# Patient Record
Sex: Male | Born: 1950 | Race: White | Hispanic: No | Marital: Married | Smoking: Current every day smoker
Health system: Southern US, Community
[De-identification: ages and names within clinical notes are randomized; demographics above are authoritative.]

## PROBLEM LIST (undated history)

## (undated) DIAGNOSIS — I10 Essential (primary) hypertension: Secondary | ICD-10-CM

---

## 2001-03-14 ENCOUNTER — Ambulatory Visit: Admission: RE | Admit: 2001-03-14 | Discharge: 2001-03-14 | Payer: Self-pay | Admitting: Family Medicine

## 2017-10-19 DIAGNOSIS — L03312 Cellulitis of back [any part except buttock]: Secondary | ICD-10-CM | POA: Diagnosis not present

## 2017-10-19 DIAGNOSIS — D171 Benign lipomatous neoplasm of skin and subcutaneous tissue of trunk: Secondary | ICD-10-CM | POA: Diagnosis not present

## 2017-10-19 DIAGNOSIS — Z6831 Body mass index (BMI) 31.0-31.9, adult: Secondary | ICD-10-CM | POA: Diagnosis not present

## 2017-10-31 DIAGNOSIS — L03312 Cellulitis of back [any part except buttock]: Secondary | ICD-10-CM | POA: Diagnosis not present

## 2017-10-31 DIAGNOSIS — I10 Essential (primary) hypertension: Secondary | ICD-10-CM | POA: Diagnosis not present

## 2017-10-31 DIAGNOSIS — Z6830 Body mass index (BMI) 30.0-30.9, adult: Secondary | ICD-10-CM | POA: Diagnosis not present

## 2017-10-31 DIAGNOSIS — D171 Benign lipomatous neoplasm of skin and subcutaneous tissue of trunk: Secondary | ICD-10-CM | POA: Diagnosis not present

## 2017-10-31 DIAGNOSIS — Z1389 Encounter for screening for other disorder: Secondary | ICD-10-CM | POA: Diagnosis not present

## 2017-10-31 DIAGNOSIS — F17210 Nicotine dependence, cigarettes, uncomplicated: Secondary | ICD-10-CM | POA: Diagnosis not present

## 2020-03-17 ENCOUNTER — Emergency Department (HOSPITAL_COMMUNITY)
Admission: EM | Admit: 2020-03-17 | Discharge: 2020-03-17 | Disposition: A | Discharge status: Home / Self Care | Payer: Medicare HMO | Attending: Emergency Medicine | Admitting: Emergency Medicine

## 2020-03-17 ENCOUNTER — Emergency Department (HOSPITAL_COMMUNITY): Payer: Medicare HMO

## 2020-03-17 ENCOUNTER — Encounter (HOSPITAL_COMMUNITY): Payer: Self-pay

## 2020-03-17 ENCOUNTER — Other Ambulatory Visit: Payer: Self-pay

## 2020-03-17 DIAGNOSIS — I10 Essential (primary) hypertension: Secondary | ICD-10-CM | POA: Insufficient documentation

## 2020-03-17 DIAGNOSIS — R55 Syncope and collapse: Secondary | ICD-10-CM | POA: Diagnosis not present

## 2020-03-17 DIAGNOSIS — R531 Weakness: Secondary | ICD-10-CM | POA: Diagnosis present

## 2020-03-17 DIAGNOSIS — R42 Dizziness and giddiness: Secondary | ICD-10-CM | POA: Diagnosis not present

## 2020-03-17 HISTORY — DX: Essential (primary) hypertension: I10

## 2020-03-17 LAB — URINALYSIS, ROUTINE W REFLEX MICROSCOPIC
Bilirubin Urine: NEGATIVE
Glucose, UA: NEGATIVE mg/dL
Hgb urine dipstick: NEGATIVE
Ketones, ur: NEGATIVE mg/dL
Leukocytes,Ua: NEGATIVE
Nitrite: NEGATIVE
Protein, ur: NEGATIVE mg/dL
Specific Gravity, Urine: 1.008 (ref 1.005–1.030)
pH: 5 (ref 5.0–8.0)

## 2020-03-17 LAB — BASIC METABOLIC PANEL
Anion gap: 10 (ref 5–15)
BUN: 28 mg/dL — ABNORMAL HIGH (ref 8–23)
CO2: 26 mmol/L (ref 22–32)
Calcium: 9.5 mg/dL (ref 8.9–10.3)
Chloride: 100 mmol/L (ref 98–111)
Creatinine, Ser: 1.64 mg/dL — ABNORMAL HIGH (ref 0.61–1.24)
GFR calc Af Amer: 49 mL/min — ABNORMAL LOW (ref >60)
GFR calc non Af Amer: 42 mL/min — ABNORMAL LOW (ref >60)
Glucose, Bld: 137 mg/dL — ABNORMAL HIGH (ref 70–99)
Potassium: 3.9 mmol/L (ref 3.5–5.1)
Sodium: 136 mmol/L (ref 135–145)

## 2020-03-17 LAB — CBC
HCT: 46.6 % (ref 39.0–52.0)
Hemoglobin: 15.3 g/dL (ref 13.0–17.0)
MCH: 31.7 pg (ref 26.0–34.0)
MCHC: 32.8 g/dL (ref 30.0–36.0)
MCV: 96.7 fL (ref 80.0–100.0)
Platelets: 224 10*3/uL (ref 150–400)
RBC: 4.82 MIL/uL (ref 4.22–5.81)
RDW: 13.4 % (ref 11.5–15.5)
WBC: 8.1 10*3/uL (ref 4.0–10.5)
nRBC: 0 % (ref 0.0–0.2)

## 2020-03-17 LAB — CBG MONITORING, ED: Glucose-Capillary: 136 mg/dL — ABNORMAL HIGH (ref 70–99)

## 2020-03-17 MED ORDER — LACTATED RINGERS IV BOLUS
1000.0000 mL | Freq: Once | INTRAVENOUS | Status: AC
  Administered 2020-03-17: 1000 mL via INTRAVENOUS

## 2020-03-17 MED ORDER — LORAZEPAM 2 MG/ML IJ SOLN
1.0000 mg | Freq: Once | INTRAMUSCULAR | Status: AC
  Administered 2020-03-17: 1 mg via INTRAVENOUS
  Filled 2020-03-17: qty 1

## 2020-03-17 NOTE — ED Triage Notes (Signed)
Pt reports 2 days of generalized weakness, lightheaded and balance issues. Pt denies any pain, a.o.

## 2020-03-17 NOTE — ED Notes (Signed)
Pt transported to MRI via stretcher at this time.  °

## 2020-03-18 DIAGNOSIS — Z6830 Body mass index (BMI) 30.0-30.9, adult: Secondary | ICD-10-CM | POA: Diagnosis not present

## 2020-03-18 DIAGNOSIS — H6123 Impacted cerumen, bilateral: Secondary | ICD-10-CM | POA: Diagnosis not present

## 2020-03-18 DIAGNOSIS — R69 Illness, unspecified: Secondary | ICD-10-CM | POA: Diagnosis not present

## 2020-03-23 NOTE — ED Provider Notes (Signed)
[facility] EMERGENCY DEPARTMENT Provider Note   CSN: [id number] Arrival date & time: 03/17/20  0830     History Chief Complaint  Patient presents with  . Weakness  . Fall    Jerime Gonya Pakistan is a 69 y.o. male.  HPI   69 year old male with generalized weakness and feeling of lightheadedness.  Describes sensation as if he may pass out and also feeling off balance.  Onset about 2 days ago.  Symptoms pretty persistent since then.  No appreciable exacerbating factors.  Denies any acute pain.  No fevers or chills.  No acute respiratory complaints.  No acute visual symptoms.  Past Medical History:  Diagnosis Date  . Hypertension     There are no problems to display for this patient.   History reviewed. No pertinent surgical history.     No family history on file.  Social History   Tobacco Use  . Smoking status: Not on file  Substance Use Topics  . Alcohol use: Not on file  . Drug use: Not on file    Home Medications Prior to Admission medications   Not on File    Allergies    Patient has no allergy information on record.  Review of Systems   Review of Systems All systems reviewed and negative, other than as noted in HPI.  Physical Exam Updated Vital Signs BP (!) 141/89 (BP Location: Right Arm)   Pulse 79   Temp 98.5 F (36.9 C) (Oral)   Resp 13   Ht 5\' 8"  (1.727 m)   Wt 86.2 kg   SpO2 100%   BMI 28.89 kg/m   Physical Exam Vitals and nursing note reviewed.  Constitutional:      General: He is not in acute distress.    Appearance: He is well-developed.  HENT:     Head: Normocephalic and atraumatic.  Eyes:     General:        Right eye: No discharge.        Left eye: No discharge.     Conjunctiva/sclera: Conjunctivae normal.  Cardiovascular:     Rate and Rhythm: Normal rate and regular rhythm.     Heart sounds: Normal heart sounds. No murmur. No friction rub. No gallop.   Pulmonary:     Effort: Pulmonary effort is normal.  No respiratory distress.     Breath sounds: Normal breath sounds.  Abdominal:     General: There is no distension.     Palpations: Abdomen is soft.     Tenderness: There is no abdominal tenderness.  Musculoskeletal:        General: No tenderness.     Cervical back: Neck supple.  Skin:    General: Skin is warm and dry.  Neurological:     General: No focal deficit present.     Mental Status: He is alert and oriented to person, place, and time.     Cranial Nerves: No cranial nerve deficit.     Sensory: No sensory deficit.     Motor: No weakness.     Coordination: Coordination normal.  Psychiatric:        Behavior: Behavior normal.        Thought Content: Thought content normal.     ED Results / Procedures / Treatments   Labs (all labs ordered are listed, but only abnormal results are displayed) Labs Reviewed  BASIC METABOLIC PANEL - Abnormal; Notable for the following components:      Result Value  Glucose, Bld 137 (*)    BUN 28 (*)    Creatinine, Ser 1.64 (*)    GFR calc non Af Amer 42 (*)    GFR calc Af Amer 49 (*)    All other components within normal limits  URINALYSIS, ROUTINE W REFLEX MICROSCOPIC - Abnormal; Notable for the following components:   Color, Urine STRAW (*)    All other components within normal limits  CBG MONITORING, ED - Abnormal; Notable for the following components:   Glucose-Capillary 136 (*)    All other components within normal limits  CBC    EKG EKG Interpretation  Date/Time:  Thursday March 17 2020 08:50:51 EDT Ventricular Rate:  71 PR Interval:  128 QRS Duration: 134 QT Interval:  420 QTC Calculation: 456 R Axis:   72 Text Interpretation: Normal sinus rhythm Right bundle branch block Abnormal ECG Confirmed by Madalyn Rob [phone]) on 03/18/2020 3:55:37 PM   Radiology No results found.   MR BRAIN WO CONTRAST  Result Date: 03/17/2020 CLINICAL DATA:  Provided history: Dizziness, nonspecific. Additional history provided: Patient  reports 2 days of generalized weakness, lightheadedness and balance issues. EXAM: MRI HEAD WITHOUT CONTRAST TECHNIQUE: Multiplanar, multiecho pulse sequences of the brain and surrounding structures were obtained without intravenous contrast. COMPARISON:  No pertinent prior studies available for comparison. FINDINGS: Brain: Mild intermittent motion degradation. There is no evidence of acute infarct. No evidence of intracranial mass. No midline shift or extra-axial fluid collection. No chronic intracranial blood products. 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 Mild scattered T2/FLAIR hyperintensity within the cerebral white matter and pons is nonspecific, but consistent with chronic small vessel ischemic disease. Mild generalized parenchymal atrophy. Prominent dural calcification along the anterior falx. Vascular: Flow voids maintained within the proximal large arterial vessels. Skull and upper cervical spine: No focal marrow lesion. Sinuses/Orbits: Visualized orbits demonstrate no acute abnormality. Mild paranasal sinus mucosal thickening within the ethmoid and sphenoid sinuses as well as right maxillary sinus. Partial opacification of a posterior right ethmoid air cell. No significant mastoid effusion. IMPRESSION: Mildly motion degraded exam. No evidence of acute intracranial abnormality, including acute infarction. Mild generalized parenchymal atrophy and chronic small vessel ischemic disease. 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 Paranasal sinus disease as described. Electronically Signed   By: Kellie Simmering DO   On: 03/17/2020 12:53    Procedures Procedures (including critical care time)  Medications Ordered in ED Medications  lactated ringers bolus 1,000 mL (0 mLs Intravenous Stopped 03/17/20 1429)  LORazepam (ATIVAN) injection 1 mg (1 mg Intravenous Given 03/17/20 1140)    ED Course  I have reviewed the triage vital signs and the  nursing notes.  Pertinent labs & imaging results that were available during my care of the patient were reviewed by me and considered in my medical decision making (see chart for details).    MDM Rules/Calculators/A&P                      69 year old male with dizziness versus near syncope.  ED work-up fairly unremarkable.  Afebrile.  Hemodynamically stable.  Neuro exam nonfocal.  MRI not showing any acute pathology.  I doubt emergent process.  PCP for ED follow-up for persistent symptoms.  Return precautions discussed.  Final Clinical Impression(s) / ED Diagnoses Final diagnoses:  Near syncope    Rx / DC Orders ED Discharge Orders    None       Kaled Allende,  Annie Main, MD 03/23/20 1207

## 2020-08-12 DIAGNOSIS — E782 Mixed hyperlipidemia: Secondary | ICD-10-CM | POA: Diagnosis not present

## 2020-08-12 DIAGNOSIS — I10 Essential (primary) hypertension: Secondary | ICD-10-CM | POA: Diagnosis not present

## 2020-08-15 DIAGNOSIS — I10 Essential (primary) hypertension: Secondary | ICD-10-CM | POA: Diagnosis not present

## 2020-08-15 DIAGNOSIS — E782 Mixed hyperlipidemia: Secondary | ICD-10-CM | POA: Diagnosis not present

## 2020-08-15 IMAGING — MR MR HEAD W/O CM
9 of 10 series · 38 of 48 positions shown · non-contrast
Comparison: No pertinent prior studies available for comparison.

CLINICAL DATA: Provided history: Dizziness, nonspecific. Additional
history provided: Patient reports 2 days of generalized weakness,
lightheadedness and balance issues.

EXAM:
MRI HEAD WITHOUT CONTRAST
TECHNIQUE: Multiplanar, multiecho pulse sequences of the brain and surrounding
structures were obtained without intravenous contrast.

[Series 3: DWI · axial · 3.0mm · 1.09mm/px · z∈[-64,+98]mm · 11 of 110 slices shown (1 of 4)]
[im 1/110]
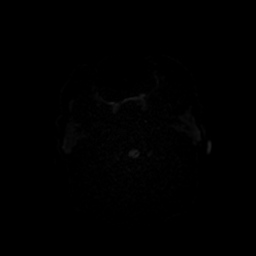
[im 11/110]
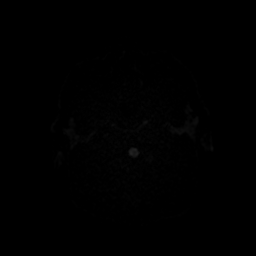
[im 22/110]
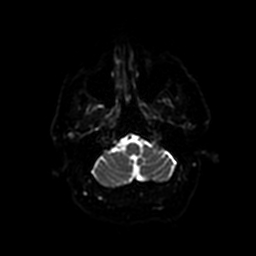
[im 33/110]
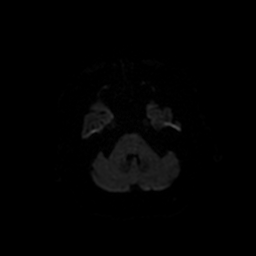
[im 44/110]
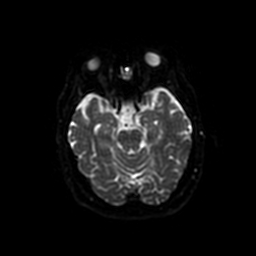
[im 55/110]
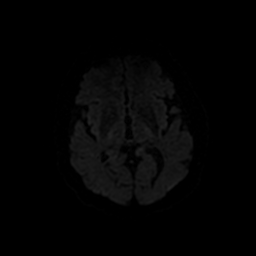
[im 66/110]
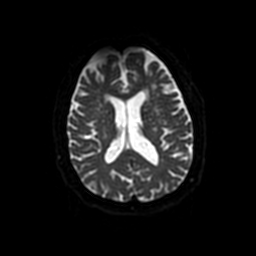
[im 77/110]
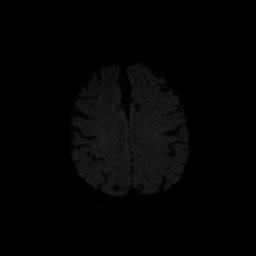
[im 88/110]
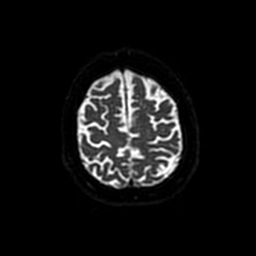
[im 99/110]
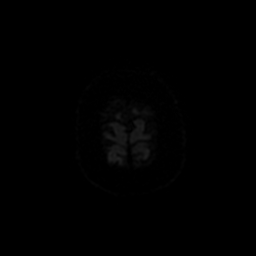
[im 110/110]
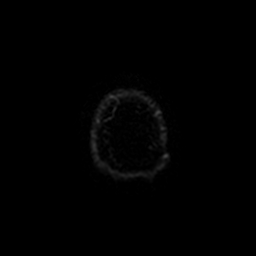

[Series 4: DWI · coronal · 5.0mm · 1.09mm/px · 8 of 76 slices shown (2 of 4)]
[im 1/76]
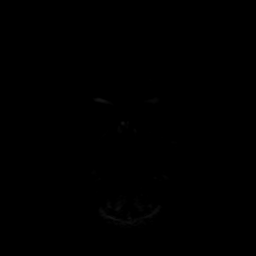
[im 11/76]
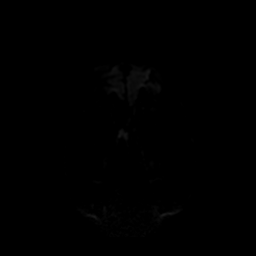
[im 22/76]
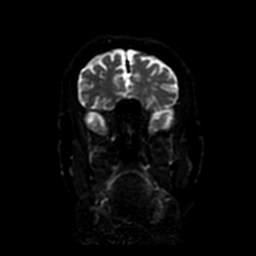
[im 33/76]
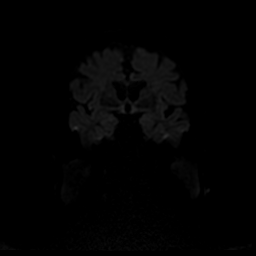
[im 43/76]
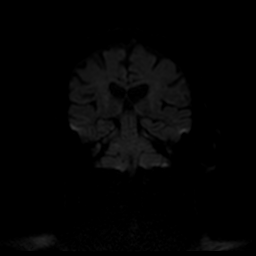
[im 54/76]
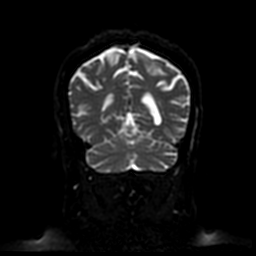
[im 65/76]
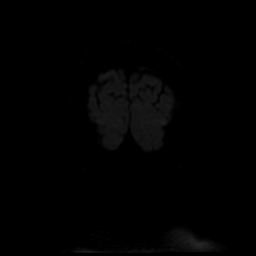
[im 76/76]
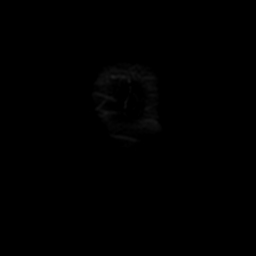

[Series 5: T1 · sagittal · 5.0mm · 0.47mm/px · 2 of 24 slices shown (1 of 2)]
[im 1/24]
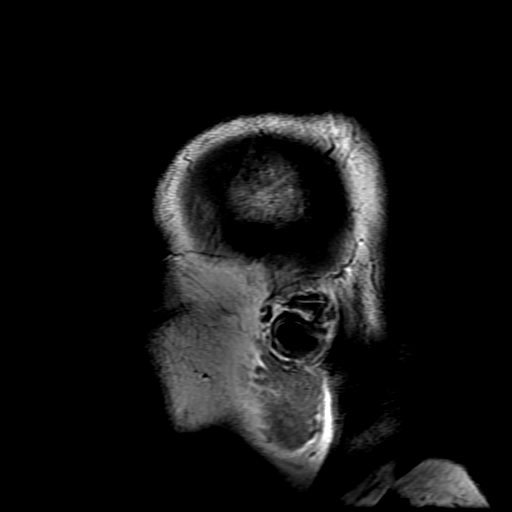
[im 24/24]
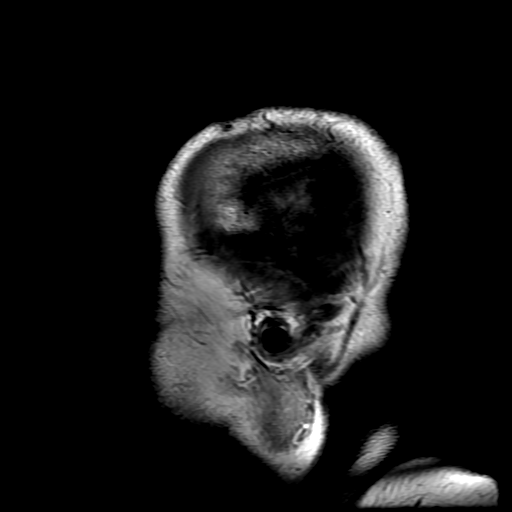

[Series 6: T2 · axial · 5.0mm · 0.43mm/px · z∈[-71,+79]mm · 2 of 26 slices shown (1 of 2)]
[im 1/26]
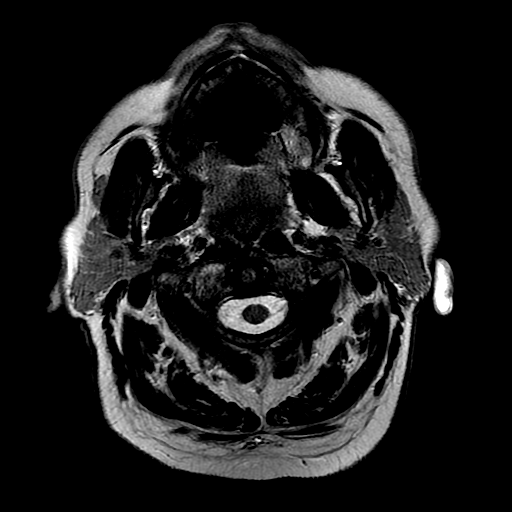
[im 26/26]
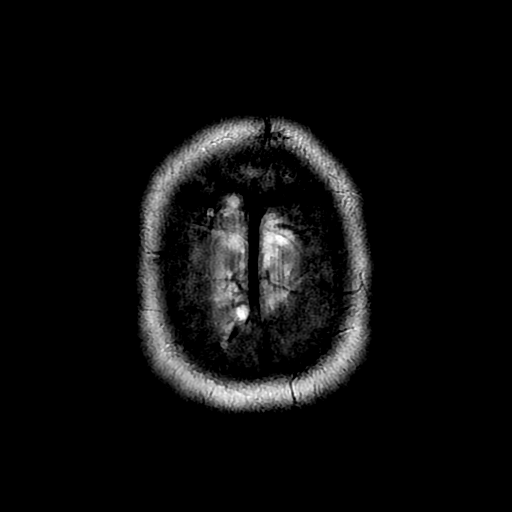

[Series 7: FLAIR · axial · 3.0mm · 0.43mm/px · z∈[-71,+79]mm · 2 of 26 slices shown]
[im 1/26]
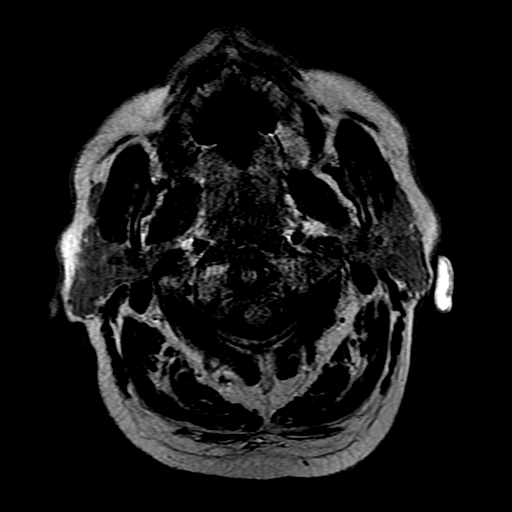
[im 26/26]
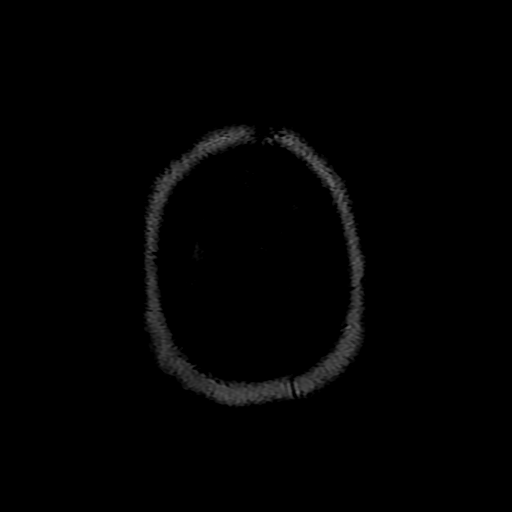

[Series 9: T1 · axial · 3.0mm · 0.47mm/px · z∈[-71,-55]mm · 2 of 108 slices shown (2 of 2)]
[im 1/108]
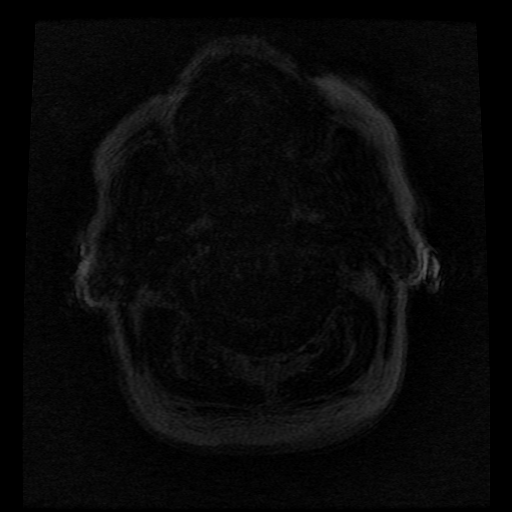
[im 12/108]
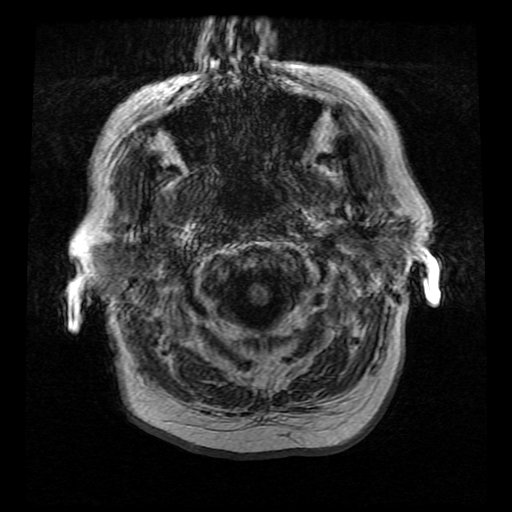

[Series 10: T2 · coronal · 5.0mm · 0.39mm/px · 2 of 24 slices shown (2 of 2)]
[im 1/24]
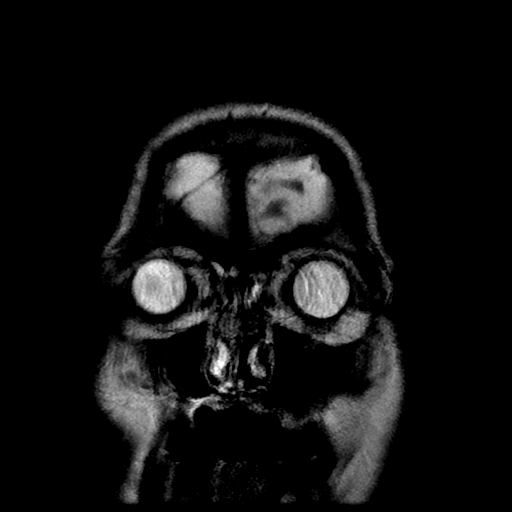
[im 24/24]
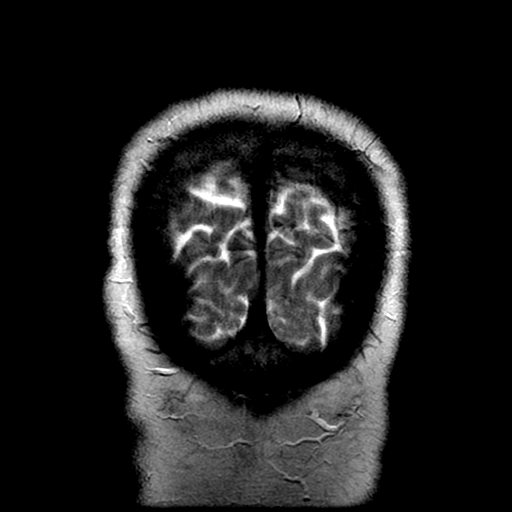

[Series 300: DWI · axial · 3.0mm · 1.09mm/px · z∈[-64,+98]mm · 5 of 55 slices shown (3 of 4)]
[im 1/55]
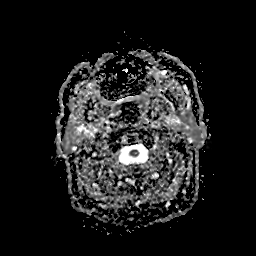
[im 14/55]
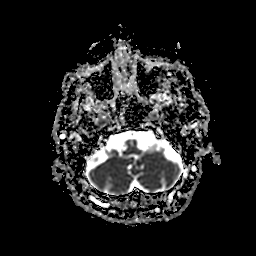
[im 28/55]
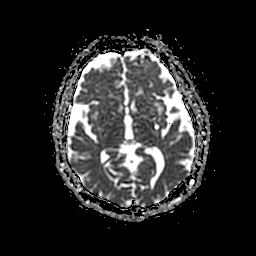
[im 41/55]
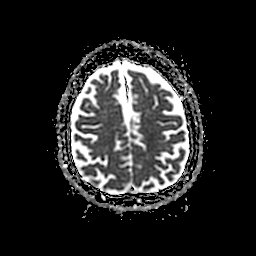
[im 55/55]
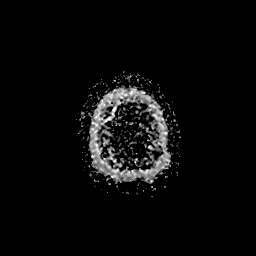

[Series 400: DWI · coronal · 5.0mm · 1.09mm/px · 4 of 38 slices shown (4 of 4)]
[im 1/38]
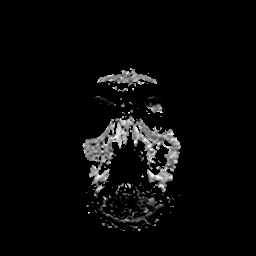
[im 13/38]
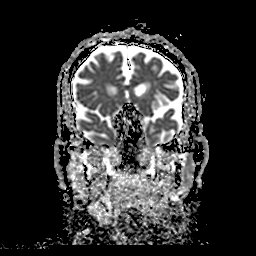
[im 25/38]
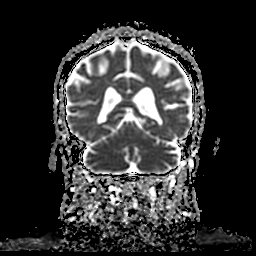
[im 38/38]
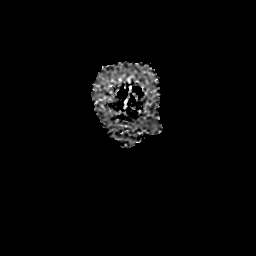

[38 of 48 positions shown; findings below may reference images not displayed]

FINDINGS: Brain:

Mild intermittent motion degradation.

There is no evidence of acute infarct.

No evidence of intracranial mass.

No midline shift or extra-axial fluid collection.

No chronic intracranial blood products.

Chronic lacunar infarcts within the left basal ganglia and left
thalamus. Mild scattered T2/FLAIR hyperintensity within the cerebral
white matter and pons is nonspecific, but consistent with chronic
small vessel ischemic disease. Mild generalized parenchymal atrophy.
Prominent dural calcification along the anterior falx.

Vascular: Flow voids maintained within the proximal large arterial
vessels.

Skull and upper cervical spine: No focal marrow lesion.

Sinuses/Orbits: Visualized orbits demonstrate no acute abnormality.
Mild paranasal sinus mucosal thickening within the ethmoid and
sphenoid sinuses as well as right maxillary sinus. Partial
opacification of a posterior right ethmoid air cell. No significant
mastoid effusion.
IMPRESSION: Mildly motion degraded exam.

No evidence of acute intracranial abnormality, including acute
infarction.

Mild generalized parenchymal atrophy and chronic small vessel
ischemic disease. Chronic lacunar infarcts within the left basal
ganglia and left thalamus.

Paranasal sinus disease as described.

## 2020-08-17 DIAGNOSIS — Z7189 Other specified counseling: Secondary | ICD-10-CM | POA: Diagnosis not present

## 2020-08-17 DIAGNOSIS — Z6832 Body mass index (BMI) 32.0-32.9, adult: Secondary | ICD-10-CM | POA: Diagnosis not present

## 2020-08-17 DIAGNOSIS — E782 Mixed hyperlipidemia: Secondary | ICD-10-CM | POA: Diagnosis not present

## 2020-08-17 DIAGNOSIS — Z1331 Encounter for screening for depression: Secondary | ICD-10-CM | POA: Diagnosis not present

## 2020-08-17 DIAGNOSIS — Z1389 Encounter for screening for other disorder: Secondary | ICD-10-CM | POA: Diagnosis not present

## 2020-08-17 DIAGNOSIS — I10 Essential (primary) hypertension: Secondary | ICD-10-CM | POA: Diagnosis not present

## 2020-08-17 DIAGNOSIS — R69 Illness, unspecified: Secondary | ICD-10-CM | POA: Diagnosis not present

## 2020-08-19 DIAGNOSIS — E782 Mixed hyperlipidemia: Secondary | ICD-10-CM | POA: Diagnosis not present

## 2020-08-19 DIAGNOSIS — Z0001 Encounter for general adult medical examination with abnormal findings: Secondary | ICD-10-CM | POA: Diagnosis not present

## 2020-08-19 DIAGNOSIS — I10 Essential (primary) hypertension: Secondary | ICD-10-CM | POA: Diagnosis not present

## 2020-08-19 DIAGNOSIS — R69 Illness, unspecified: Secondary | ICD-10-CM | POA: Diagnosis not present

## 2020-12-16 DIAGNOSIS — Z1329 Encounter for screening for other suspected endocrine disorder: Secondary | ICD-10-CM | POA: Diagnosis not present

## 2020-12-16 DIAGNOSIS — I10 Essential (primary) hypertension: Secondary | ICD-10-CM | POA: Diagnosis not present

## 2020-12-16 DIAGNOSIS — Z1159 Encounter for screening for other viral diseases: Secondary | ICD-10-CM | POA: Diagnosis not present

## 2020-12-16 DIAGNOSIS — Z125 Encounter for screening for malignant neoplasm of prostate: Secondary | ICD-10-CM | POA: Diagnosis not present

## 2020-12-16 DIAGNOSIS — E782 Mixed hyperlipidemia: Secondary | ICD-10-CM | POA: Diagnosis not present

## 2020-12-16 DIAGNOSIS — Z720 Tobacco use: Secondary | ICD-10-CM | POA: Diagnosis not present

## 2020-12-16 DIAGNOSIS — E7849 Other hyperlipidemia: Secondary | ICD-10-CM | POA: Diagnosis not present

## 2020-12-28 DIAGNOSIS — Z6834 Body mass index (BMI) 34.0-34.9, adult: Secondary | ICD-10-CM | POA: Diagnosis not present

## 2020-12-28 DIAGNOSIS — G4733 Obstructive sleep apnea (adult) (pediatric): Secondary | ICD-10-CM | POA: Diagnosis not present

## 2020-12-28 DIAGNOSIS — E7849 Other hyperlipidemia: Secondary | ICD-10-CM | POA: Diagnosis not present

## 2020-12-28 DIAGNOSIS — N1831 Chronic kidney disease, stage 3a: Secondary | ICD-10-CM | POA: Diagnosis not present

## 2020-12-28 DIAGNOSIS — I10 Essential (primary) hypertension: Secondary | ICD-10-CM | POA: Diagnosis not present

## 2020-12-28 DIAGNOSIS — R69 Illness, unspecified: Secondary | ICD-10-CM | POA: Diagnosis not present

## 2020-12-28 DIAGNOSIS — J441 Chronic obstructive pulmonary disease with (acute) exacerbation: Secondary | ICD-10-CM | POA: Diagnosis not present

## 2021-01-20 DIAGNOSIS — R059 Cough, unspecified: Secondary | ICD-10-CM | POA: Diagnosis not present

## 2021-01-20 DIAGNOSIS — J329 Chronic sinusitis, unspecified: Secondary | ICD-10-CM | POA: Diagnosis not present

## 2021-01-20 DIAGNOSIS — Z20828 Contact with and (suspected) exposure to other viral communicable diseases: Secondary | ICD-10-CM | POA: Diagnosis not present

## 2021-02-21 DIAGNOSIS — J329 Chronic sinusitis, unspecified: Secondary | ICD-10-CM | POA: Diagnosis not present

## 2021-02-21 DIAGNOSIS — I10 Essential (primary) hypertension: Secondary | ICD-10-CM | POA: Diagnosis not present

## 2021-04-12 DIAGNOSIS — A084 Viral intestinal infection, unspecified: Secondary | ICD-10-CM | POA: Diagnosis not present

## 2021-04-12 DIAGNOSIS — Z20828 Contact with and (suspected) exposure to other viral communicable diseases: Secondary | ICD-10-CM | POA: Diagnosis not present

## 2021-04-21 DIAGNOSIS — N1831 Chronic kidney disease, stage 3a: Secondary | ICD-10-CM | POA: Diagnosis not present

## 2021-04-21 DIAGNOSIS — E782 Mixed hyperlipidemia: Secondary | ICD-10-CM | POA: Diagnosis not present

## 2021-04-21 DIAGNOSIS — E7849 Other hyperlipidemia: Secondary | ICD-10-CM | POA: Diagnosis not present

## 2021-04-21 DIAGNOSIS — I10 Essential (primary) hypertension: Secondary | ICD-10-CM | POA: Diagnosis not present

## 2021-04-25 DIAGNOSIS — J449 Chronic obstructive pulmonary disease, unspecified: Secondary | ICD-10-CM | POA: Diagnosis not present

## 2021-04-25 DIAGNOSIS — I10 Essential (primary) hypertension: Secondary | ICD-10-CM | POA: Diagnosis not present

## 2021-04-25 DIAGNOSIS — Z6832 Body mass index (BMI) 32.0-32.9, adult: Secondary | ICD-10-CM | POA: Diagnosis not present

## 2021-04-25 DIAGNOSIS — E7849 Other hyperlipidemia: Secondary | ICD-10-CM | POA: Diagnosis not present

## 2021-04-25 DIAGNOSIS — G4733 Obstructive sleep apnea (adult) (pediatric): Secondary | ICD-10-CM | POA: Diagnosis not present

## 2021-04-25 DIAGNOSIS — R69 Illness, unspecified: Secondary | ICD-10-CM | POA: Diagnosis not present

## 2021-04-25 DIAGNOSIS — N1831 Chronic kidney disease, stage 3a: Secondary | ICD-10-CM | POA: Diagnosis not present

## 2021-04-25 DIAGNOSIS — F17210 Nicotine dependence, cigarettes, uncomplicated: Secondary | ICD-10-CM | POA: Diagnosis not present

## 2021-07-03 DIAGNOSIS — H04123 Dry eye syndrome of bilateral lacrimal glands: Secondary | ICD-10-CM | POA: Diagnosis not present

## 2021-08-28 DIAGNOSIS — H04123 Dry eye syndrome of bilateral lacrimal glands: Secondary | ICD-10-CM | POA: Diagnosis not present

## 2021-09-01 DIAGNOSIS — R739 Hyperglycemia, unspecified: Secondary | ICD-10-CM | POA: Diagnosis not present

## 2021-09-01 DIAGNOSIS — E782 Mixed hyperlipidemia: Secondary | ICD-10-CM | POA: Diagnosis not present

## 2021-09-01 DIAGNOSIS — N1831 Chronic kidney disease, stage 3a: Secondary | ICD-10-CM | POA: Diagnosis not present

## 2021-09-01 DIAGNOSIS — E7849 Other hyperlipidemia: Secondary | ICD-10-CM | POA: Diagnosis not present

## 2021-09-01 DIAGNOSIS — I10 Essential (primary) hypertension: Secondary | ICD-10-CM | POA: Diagnosis not present

## 2021-09-04 DIAGNOSIS — N1831 Chronic kidney disease, stage 3a: Secondary | ICD-10-CM | POA: Diagnosis not present

## 2021-09-04 DIAGNOSIS — E7849 Other hyperlipidemia: Secondary | ICD-10-CM | POA: Diagnosis not present

## 2021-09-04 DIAGNOSIS — R69 Illness, unspecified: Secondary | ICD-10-CM | POA: Diagnosis not present

## 2021-09-04 DIAGNOSIS — Z0001 Encounter for general adult medical examination with abnormal findings: Secondary | ICD-10-CM | POA: Diagnosis not present

## 2021-09-04 DIAGNOSIS — I10 Essential (primary) hypertension: Secondary | ICD-10-CM | POA: Diagnosis not present

## 2021-09-04 DIAGNOSIS — J449 Chronic obstructive pulmonary disease, unspecified: Secondary | ICD-10-CM | POA: Diagnosis not present

## 2021-09-04 DIAGNOSIS — Z6832 Body mass index (BMI) 32.0-32.9, adult: Secondary | ICD-10-CM | POA: Diagnosis not present

## 2021-09-19 DIAGNOSIS — F17210 Nicotine dependence, cigarettes, uncomplicated: Secondary | ICD-10-CM | POA: Diagnosis not present

## 2021-09-19 DIAGNOSIS — Z122 Encounter for screening for malignant neoplasm of respiratory organs: Secondary | ICD-10-CM | POA: Diagnosis not present

## 2021-09-19 DIAGNOSIS — R69 Illness, unspecified: Secondary | ICD-10-CM | POA: Diagnosis not present

## 2021-09-27 DIAGNOSIS — R062 Wheezing: Secondary | ICD-10-CM | POA: Diagnosis not present

## 2021-09-27 DIAGNOSIS — J441 Chronic obstructive pulmonary disease with (acute) exacerbation: Secondary | ICD-10-CM | POA: Diagnosis not present

## 2021-09-27 DIAGNOSIS — R059 Cough, unspecified: Secondary | ICD-10-CM | POA: Diagnosis not present

## 2021-12-01 DIAGNOSIS — H52223 Regular astigmatism, bilateral: Secondary | ICD-10-CM | POA: Diagnosis not present

## 2021-12-01 DIAGNOSIS — H5203 Hypermetropia, bilateral: Secondary | ICD-10-CM | POA: Diagnosis not present

## 2021-12-01 DIAGNOSIS — H25013 Cortical age-related cataract, bilateral: Secondary | ICD-10-CM | POA: Diagnosis not present

## 2021-12-01 DIAGNOSIS — H524 Presbyopia: Secondary | ICD-10-CM | POA: Diagnosis not present

## 2021-12-01 DIAGNOSIS — H353112 Nonexudative age-related macular degeneration, right eye, intermediate dry stage: Secondary | ICD-10-CM | POA: Diagnosis not present

## 2021-12-01 DIAGNOSIS — H2513 Age-related nuclear cataract, bilateral: Secondary | ICD-10-CM | POA: Diagnosis not present

## 2021-12-01 DIAGNOSIS — H25813 Combined forms of age-related cataract, bilateral: Secondary | ICD-10-CM | POA: Diagnosis not present

## 2021-12-01 DIAGNOSIS — H353122 Nonexudative age-related macular degeneration, left eye, intermediate dry stage: Secondary | ICD-10-CM | POA: Diagnosis not present

## 2021-12-29 DIAGNOSIS — R69 Illness, unspecified: Secondary | ICD-10-CM | POA: Diagnosis not present

## 2021-12-29 DIAGNOSIS — Z6832 Body mass index (BMI) 32.0-32.9, adult: Secondary | ICD-10-CM | POA: Diagnosis not present

## 2021-12-29 DIAGNOSIS — R059 Cough, unspecified: Secondary | ICD-10-CM | POA: Diagnosis not present

## 2021-12-29 DIAGNOSIS — J449 Chronic obstructive pulmonary disease, unspecified: Secondary | ICD-10-CM | POA: Diagnosis not present

## 2021-12-29 DIAGNOSIS — Z20828 Contact with and (suspected) exposure to other viral communicable diseases: Secondary | ICD-10-CM | POA: Diagnosis not present

## 2022-02-05 DIAGNOSIS — I700 Atherosclerosis of aorta: Secondary | ICD-10-CM | POA: Diagnosis not present

## 2022-02-05 DIAGNOSIS — Z1212 Encounter for screening for malignant neoplasm of rectum: Secondary | ICD-10-CM | POA: Diagnosis not present

## 2022-02-05 DIAGNOSIS — Z6833 Body mass index (BMI) 33.0-33.9, adult: Secondary | ICD-10-CM | POA: Diagnosis not present

## 2022-02-05 DIAGNOSIS — F17210 Nicotine dependence, cigarettes, uncomplicated: Secondary | ICD-10-CM | POA: Diagnosis not present

## 2022-02-05 DIAGNOSIS — R69 Illness, unspecified: Secondary | ICD-10-CM | POA: Diagnosis not present

## 2022-02-05 DIAGNOSIS — Z23 Encounter for immunization: Secondary | ICD-10-CM | POA: Diagnosis not present

## 2022-02-05 DIAGNOSIS — E782 Mixed hyperlipidemia: Secondary | ICD-10-CM | POA: Diagnosis not present

## 2022-02-05 DIAGNOSIS — F331 Major depressive disorder, recurrent, moderate: Secondary | ICD-10-CM | POA: Diagnosis not present

## 2022-02-05 DIAGNOSIS — I10 Essential (primary) hypertension: Secondary | ICD-10-CM | POA: Diagnosis not present

## 2022-02-05 DIAGNOSIS — N1831 Chronic kidney disease, stage 3a: Secondary | ICD-10-CM | POA: Diagnosis not present

## 2022-02-05 DIAGNOSIS — G4733 Obstructive sleep apnea (adult) (pediatric): Secondary | ICD-10-CM | POA: Diagnosis not present

## 2022-02-05 DIAGNOSIS — E6609 Other obesity due to excess calories: Secondary | ICD-10-CM | POA: Diagnosis not present

## 2022-02-05 DIAGNOSIS — J449 Chronic obstructive pulmonary disease, unspecified: Secondary | ICD-10-CM | POA: Diagnosis not present

## 2022-02-05 DIAGNOSIS — E7849 Other hyperlipidemia: Secondary | ICD-10-CM | POA: Diagnosis not present

## 2022-06-26 DIAGNOSIS — H353122 Nonexudative age-related macular degeneration, left eye, intermediate dry stage: Secondary | ICD-10-CM | POA: Diagnosis not present

## 2022-08-01 DIAGNOSIS — E782 Mixed hyperlipidemia: Secondary | ICD-10-CM | POA: Diagnosis not present

## 2022-08-01 DIAGNOSIS — I10 Essential (primary) hypertension: Secondary | ICD-10-CM | POA: Diagnosis not present

## 2022-08-01 DIAGNOSIS — N1831 Chronic kidney disease, stage 3a: Secondary | ICD-10-CM | POA: Diagnosis not present

## 2022-08-01 DIAGNOSIS — E7849 Other hyperlipidemia: Secondary | ICD-10-CM | POA: Diagnosis not present

## 2022-08-01 DIAGNOSIS — R739 Hyperglycemia, unspecified: Secondary | ICD-10-CM | POA: Diagnosis not present

## 2022-08-01 DIAGNOSIS — Z125 Encounter for screening for malignant neoplasm of prostate: Secondary | ICD-10-CM | POA: Diagnosis not present

## 2022-08-01 DIAGNOSIS — Z1329 Encounter for screening for other suspected endocrine disorder: Secondary | ICD-10-CM | POA: Diagnosis not present

## 2022-08-01 DIAGNOSIS — Z0001 Encounter for general adult medical examination with abnormal findings: Secondary | ICD-10-CM | POA: Diagnosis not present

## 2022-08-08 DIAGNOSIS — E7849 Other hyperlipidemia: Secondary | ICD-10-CM | POA: Diagnosis not present

## 2022-08-08 DIAGNOSIS — Z0001 Encounter for general adult medical examination with abnormal findings: Secondary | ICD-10-CM | POA: Diagnosis not present

## 2022-08-08 DIAGNOSIS — E6609 Other obesity due to excess calories: Secondary | ICD-10-CM | POA: Diagnosis not present

## 2022-08-08 DIAGNOSIS — I700 Atherosclerosis of aorta: Secondary | ICD-10-CM | POA: Diagnosis not present

## 2022-08-08 DIAGNOSIS — Z23 Encounter for immunization: Secondary | ICD-10-CM | POA: Diagnosis not present

## 2022-08-08 DIAGNOSIS — J449 Chronic obstructive pulmonary disease, unspecified: Secondary | ICD-10-CM | POA: Diagnosis not present

## 2022-08-08 DIAGNOSIS — R69 Illness, unspecified: Secondary | ICD-10-CM | POA: Diagnosis not present

## 2022-08-08 DIAGNOSIS — I10 Essential (primary) hypertension: Secondary | ICD-10-CM | POA: Diagnosis not present

## 2022-08-08 DIAGNOSIS — Z6832 Body mass index (BMI) 32.0-32.9, adult: Secondary | ICD-10-CM | POA: Diagnosis not present

## 2022-08-08 DIAGNOSIS — N1831 Chronic kidney disease, stage 3a: Secondary | ICD-10-CM | POA: Diagnosis not present

## 2022-08-08 DIAGNOSIS — G4733 Obstructive sleep apnea (adult) (pediatric): Secondary | ICD-10-CM | POA: Diagnosis not present

## 2023-01-02 ENCOUNTER — Other Ambulatory Visit: Payer: Self-pay

## 2023-01-02 ENCOUNTER — Encounter (HOSPITAL_COMMUNITY): Payer: Self-pay

## 2023-01-02 ENCOUNTER — Inpatient Hospital Stay (HOSPITAL_COMMUNITY)
Admission: EM | Admit: 2023-01-02 | Discharge: 2023-01-05 | DRG: 916 | Disposition: A | Discharge status: Home / Self Care | Payer: Medicare HMO | Attending: Family Medicine | Admitting: Family Medicine

## 2023-01-02 ENCOUNTER — Emergency Department (HOSPITAL_COMMUNITY): Payer: Medicare HMO

## 2023-01-02 DIAGNOSIS — J384 Edema of larynx: Secondary | ICD-10-CM | POA: Diagnosis not present

## 2023-01-02 DIAGNOSIS — T783XXA Angioneurotic edema, initial encounter: Principal | ICD-10-CM | POA: Diagnosis present

## 2023-01-02 DIAGNOSIS — J441 Chronic obstructive pulmonary disease with (acute) exacerbation: Secondary | ICD-10-CM | POA: Diagnosis not present

## 2023-01-02 DIAGNOSIS — E782 Mixed hyperlipidemia: Secondary | ICD-10-CM | POA: Diagnosis not present

## 2023-01-02 DIAGNOSIS — T464X5A Adverse effect of angiotensin-converting-enzyme inhibitors, initial encounter: Secondary | ICD-10-CM | POA: Diagnosis present

## 2023-01-02 DIAGNOSIS — Z7982 Long term (current) use of aspirin: Secondary | ICD-10-CM

## 2023-01-02 DIAGNOSIS — F17200 Nicotine dependence, unspecified, uncomplicated: Secondary | ICD-10-CM | POA: Insufficient documentation

## 2023-01-02 DIAGNOSIS — Z1152 Encounter for screening for COVID-19: Secondary | ICD-10-CM

## 2023-01-02 DIAGNOSIS — J449 Chronic obstructive pulmonary disease, unspecified: Secondary | ICD-10-CM | POA: Diagnosis present

## 2023-01-02 DIAGNOSIS — N179 Acute kidney failure, unspecified: Secondary | ICD-10-CM | POA: Diagnosis not present

## 2023-01-02 DIAGNOSIS — I10 Essential (primary) hypertension: Secondary | ICD-10-CM | POA: Diagnosis present

## 2023-01-02 DIAGNOSIS — F1010 Alcohol abuse, uncomplicated: Secondary | ICD-10-CM | POA: Diagnosis present

## 2023-01-02 DIAGNOSIS — Z79899 Other long term (current) drug therapy: Secondary | ICD-10-CM

## 2023-01-02 DIAGNOSIS — E876 Hypokalemia: Secondary | ICD-10-CM | POA: Diagnosis present

## 2023-01-02 DIAGNOSIS — E669 Obesity, unspecified: Secondary | ICD-10-CM | POA: Diagnosis present

## 2023-01-02 DIAGNOSIS — E785 Hyperlipidemia, unspecified: Secondary | ICD-10-CM | POA: Insufficient documentation

## 2023-01-02 DIAGNOSIS — Z7951 Long term (current) use of inhaled steroids: Secondary | ICD-10-CM

## 2023-01-02 DIAGNOSIS — F17210 Nicotine dependence, cigarettes, uncomplicated: Secondary | ICD-10-CM | POA: Diagnosis present

## 2023-01-02 DIAGNOSIS — Z6831 Body mass index (BMI) 31.0-31.9, adult: Secondary | ICD-10-CM

## 2023-01-02 DIAGNOSIS — Z66 Do not resuscitate: Secondary | ICD-10-CM | POA: Diagnosis present

## 2023-01-02 LAB — COMPREHENSIVE METABOLIC PANEL
ALT: 28 U/L (ref 0–44)
AST: 31 U/L (ref 15–41)
Albumin: 4.1 g/dL (ref 3.5–5.0)
Alkaline Phosphatase: 60 U/L (ref 38–126)
Anion gap: 14 (ref 5–15)
BUN: 13 mg/dL (ref 8–23)
CO2: 27 mmol/L (ref 22–32)
Calcium: 9.8 mg/dL (ref 8.9–10.3)
Chloride: 95 mmol/L — ABNORMAL LOW (ref 98–111)
Creatinine, Ser: 1.12 mg/dL (ref 0.61–1.24)
GFR, Estimated: >60 mL/min (ref >60)
Glucose, Bld: 84 mg/dL (ref 70–99)
Potassium: 3 mmol/L — ABNORMAL LOW (ref 3.5–5.1)
Sodium: 136 mmol/L (ref 135–145)
Total Bilirubin: 0.7 mg/dL (ref 0.3–1.2)
Total Protein: 7.2 g/dL (ref 6.5–8.1)

## 2023-01-02 LAB — CBC WITH DIFFERENTIAL/PLATELET
Abs Immature Granulocytes: 0.02 10*3/uL (ref 0.00–0.07)
Basophils Absolute: 0 10*3/uL (ref 0.0–0.1)
Basophils Relative: 1 %
Eosinophils Absolute: 0.2 10*3/uL (ref 0.0–0.5)
Eosinophils Relative: 3 %
HCT: 49.7 % (ref 39.0–52.0)
Hemoglobin: 16.9 g/dL (ref 13.0–17.0)
Immature Granulocytes: 0 %
Lymphocytes Relative: 28 %
Lymphs Abs: 2.3 10*3/uL (ref 0.7–4.0)
MCH: 32.6 pg (ref 26.0–34.0)
MCHC: 34 g/dL (ref 30.0–36.0)
MCV: 95.9 fL (ref 80.0–100.0)
Monocytes Absolute: 1.1 10*3/uL — ABNORMAL HIGH (ref 0.1–1.0)
Monocytes Relative: 13 %
Neutro Abs: 4.7 10*3/uL (ref 1.7–7.7)
Neutrophils Relative %: 55 %
Platelets: 202 10*3/uL (ref 150–400)
RBC: 5.18 MIL/uL (ref 4.22–5.81)
RDW: 13.9 % (ref 11.5–15.5)
WBC: 8.3 10*3/uL (ref 4.0–10.5)
nRBC: 0 % (ref 0.0–0.2)

## 2023-01-02 LAB — RESP PANEL BY RT-PCR (RSV, FLU A&B, COVID)  RVPGX2
Influenza A by PCR: NEGATIVE
Influenza B by PCR: NEGATIVE
Resp Syncytial Virus by PCR: NEGATIVE
SARS Coronavirus 2 by RT PCR: NEGATIVE

## 2023-01-02 LAB — GROUP A STREP BY PCR: Group A Strep by PCR: NOT DETECTED

## 2023-01-02 MED ORDER — HEPARIN SODIUM (PORCINE) 5000 UNIT/ML IJ SOLN
5000.0000 [IU] | Freq: Three times a day (TID) | INTRAMUSCULAR | Status: DC
  Administered 2023-01-03 (×2): 5000 [IU] via SUBCUTANEOUS
  Filled 2023-01-02 (×2): qty 1

## 2023-01-02 MED ORDER — DIPHENHYDRAMINE HCL 50 MG/ML IJ SOLN
25.0000 mg | Freq: Four times a day (QID) | INTRAMUSCULAR | Status: DC
  Administered 2023-01-03 – 2023-01-04 (×5): 25 mg via INTRAVENOUS
  Filled 2023-01-02 (×5): qty 1

## 2023-01-02 MED ORDER — HYDROCHLOROTHIAZIDE 25 MG PO TABS
25.0000 mg | ORAL_TABLET | Freq: Every day | ORAL | Status: DC
  Administered 2023-01-03: 25 mg via ORAL
  Filled 2023-01-02: qty 1

## 2023-01-02 MED ORDER — ACETAMINOPHEN 325 MG PO TABS: 650.0000 mg | ORAL_TABLET | Freq: Four times a day (QID) | ORAL | Status: DC | PRN

## 2023-01-02 MED ORDER — POTASSIUM CHLORIDE 10 MEQ/100ML IV SOLN
10.0000 meq | INTRAVENOUS | Status: AC
  Administered 2023-01-02 – 2023-01-03 (×4): 10 meq via INTRAVENOUS
  Filled 2023-01-02 (×4): qty 100

## 2023-01-02 MED ORDER — ASPIRIN 81 MG PO TBEC
81.0000 mg | DELAYED_RELEASE_TABLET | Freq: Every day | ORAL | Status: DC
  Administered 2023-01-03 – 2023-01-05 (×3): 81 mg via ORAL
  Filled 2023-01-02 (×3): qty 1

## 2023-01-02 MED ORDER — HEPARIN SODIUM (PORCINE) 5000 UNIT/ML IJ SOLN: 5000.0000 [IU] | Freq: Three times a day (TID) | INTRAMUSCULAR | Status: DC

## 2023-01-02 MED ORDER — UMECLIDINIUM BROMIDE 62.5 MCG/ACT IN AEPB
1.0000 | INHALATION_SPRAY | Freq: Every day | RESPIRATORY_TRACT | Status: DC
  Administered 2023-01-04 – 2023-01-05 (×2): 1 via RESPIRATORY_TRACT
  Filled 2023-01-02: qty 7

## 2023-01-02 MED ORDER — POTASSIUM CHLORIDE 10 MEQ/100ML IV SOLN
10.0000 meq | Freq: Once | INTRAVENOUS | Status: AC
  Administered 2023-01-02: 10 meq via INTRAVENOUS
  Filled 2023-01-02: qty 100

## 2023-01-02 MED ORDER — IOHEXOL 300 MG/ML  SOLN
75.0000 mL | Freq: Once | INTRAMUSCULAR | Status: AC | PRN
  Administered 2023-01-02: 75 mL via INTRAVENOUS

## 2023-01-02 MED ORDER — DIPHENHYDRAMINE HCL 50 MG/ML IJ SOLN
25.0000 mg | Freq: Once | INTRAMUSCULAR | Status: AC
  Administered 2023-01-02: 25 mg via INTRAMUSCULAR
  Filled 2023-01-02: qty 1

## 2023-01-02 MED ORDER — DEXAMETHASONE SODIUM PHOSPHATE 10 MG/ML IJ SOLN
10.0000 mg | Freq: Once | INTRAMUSCULAR | Status: AC
  Administered 2023-01-02: 10 mg via INTRAMUSCULAR
  Filled 2023-01-02: qty 1

## 2023-01-02 MED ORDER — ATORVASTATIN CALCIUM 20 MG PO TABS
20.0000 mg | ORAL_TABLET | Freq: Every day | ORAL | Status: DC
  Administered 2023-01-03 – 2023-01-05 (×3): 20 mg via ORAL
  Filled 2023-01-02: qty 2
  Filled 2023-01-02 (×2): qty 1

## 2023-01-02 MED ORDER — METHYLPREDNISOLONE SODIUM SUCC 125 MG IJ SOLR
125.0000 mg | Freq: Once | INTRAMUSCULAR | Status: AC
  Administered 2023-01-02: 125 mg via INTRAVENOUS
  Filled 2023-01-02: qty 2

## 2023-01-02 MED ORDER — FAMOTIDINE IN NACL 20-0.9 MG/50ML-% IV SOLN
20.0000 mg | Freq: Once | INTRAVENOUS | Status: AC
  Administered 2023-01-02: 20 mg via INTRAVENOUS
  Filled 2023-01-02: qty 50

## 2023-01-02 MED ORDER — FAMOTIDINE IN NACL 20-0.9 MG/50ML-% IV SOLN
20.0000 mg | Freq: Two times a day (BID) | INTRAVENOUS | Status: DC
  Administered 2023-01-03 – 2023-01-04 (×4): 20 mg via INTRAVENOUS
  Filled 2023-01-02 (×4): qty 50

## 2023-01-02 MED ORDER — MORPHINE SULFATE (PF) 2 MG/ML IV SOLN: 2.0000 mg | INTRAVENOUS | Status: DC | PRN

## 2023-01-02 MED ORDER — METHYLPREDNISOLONE SODIUM SUCC 125 MG IJ SOLR
125.0000 mg | Freq: Two times a day (BID) | INTRAMUSCULAR | Status: DC
  Administered 2023-01-03 (×2): 125 mg via INTRAVENOUS
  Filled 2023-01-02 (×2): qty 2

## 2023-01-02 MED ORDER — ACETAMINOPHEN 650 MG RE SUPP: 650.0000 mg | Freq: Four times a day (QID) | RECTAL | Status: DC | PRN

## 2023-01-02 MED ORDER — ONDANSETRON HCL 4 MG PO TABS: 4.0000 mg | ORAL_TABLET | Freq: Four times a day (QID) | ORAL | Status: DC | PRN

## 2023-01-02 MED ORDER — DIPHENHYDRAMINE HCL 50 MG/ML IJ SOLN
25.0000 mg | Freq: Once | INTRAMUSCULAR | Status: AC
  Administered 2023-01-02: 25 mg via INTRAVENOUS
  Filled 2023-01-02: qty 1

## 2023-01-02 MED ORDER — SODIUM CHLORIDE 0.9 % IV SOLN
1.0000 g | Freq: Once | INTRAVENOUS | Status: AC
  Administered 2023-01-02: 1 g via INTRAVENOUS
  Filled 2023-01-02: qty 10

## 2023-01-02 MED ORDER — ONDANSETRON HCL 4 MG/2ML IJ SOLN: 4.0000 mg | Freq: Four times a day (QID) | INTRAMUSCULAR | Status: DC | PRN

## 2023-01-02 MED ORDER — OXYCODONE HCL 5 MG PO TABS: 5.0000 mg | ORAL_TABLET | ORAL | Status: DC | PRN

## 2023-01-02 MED ORDER — FLUTICASONE FUROATE-VILANTEROL 200-25 MCG/ACT IN AEPB
1.0000 | INHALATION_SPRAY | Freq: Every day | RESPIRATORY_TRACT | Status: DC
  Administered 2023-01-04 – 2023-01-05 (×2): 1 via RESPIRATORY_TRACT
  Filled 2023-01-02: qty 28

## 2023-01-02 NOTE — ED Notes (Signed)
Patient transported to CT 

## 2023-01-02 NOTE — ED Triage Notes (Signed)
Pt woke up from a nap this afternoon and has severe left side tongue and throat swelling.

## 2023-01-02 NOTE — ED Provider Notes (Signed)
[facility] Provider Note   CSN: [id number] Arrival date & time: 01/02/23  1821     History  Chief Complaint  Patient presents with   Oral Swelling    Coston Mandato [place] is a 72 y.o. male.  Patient was fine until 1600 today.  When he got sudden swelling of the left side of his tongue and had swelling in his throat and soreness there.  Felt fine earlier.  Patient has been on lisinopril hydrochlorothiazide for many years.  Never has had a reaction like this.  Patient denies any fever.  Patient denies any recent upper respiratory infections.  Past medical history is only significant for hypertension.  Patient is on Lipitor and lisinopril hydrochlorothiazide and Trelegy Ellipta.  Patient is an everyday smoker.       Home Medications Prior to Admission medications   Medication Sig Start Date End Date Taking? Authorizing Provider  aspirin EC 81 MG tablet Take 81 mg by mouth daily. Swallow whole.   Yes [provider]  atorvastatin (LIPITOR) 20 MG tablet Take 20 mg by mouth daily. 10/05/22  Yes [provider]  guaiFENesin (MUCINEX) 600 MG 12 hr tablet Take 600 mg by mouth 2 (two) times daily.   Yes [provider]  lisinopril-hydrochlorothiazide (ZESTORETIC) 20-25 MG tablet Take 1 tablet by mouth daily. 11/13/22  Yes [provider]  Donnal Debar 200-62.5-25 MCG/ACT AEPB Take 1 puff by mouth daily. 10/26/22  Yes [provider]      Allergies    Patient has no known allergies.    Review of Systems   Review of Systems  Constitutional:  Negative for chills and fever.  HENT:  Positive for sore throat. Negative for drooling, ear pain and voice change.   Eyes:  Negative for pain and visual disturbance.  Respiratory:  Negative for cough and shortness of breath.   Cardiovascular:  Negative for chest pain and palpitations.  Gastrointestinal:  Negative for abdominal pain and vomiting.   Genitourinary:  Negative for dysuria and hematuria.  Musculoskeletal:  Negative for arthralgias and back pain.  Skin:  Negative for color change and rash.  Neurological:  Negative for seizures and syncope.  All other systems reviewed and are negative.   Physical Exam Updated Vital Signs BP (!) 158/109   Pulse 66   Temp 98.4 F (36.9 C)   Resp 18   Ht 1.753 m ('5\' 9"'$ )   Wt 93 kg   SpO2 95%   BMI 30.27 kg/m  Physical Exam Vitals and nursing note reviewed.  Constitutional:      General: He is not in acute distress.    Appearance: Normal appearance. He is well-developed. He is not ill-appearing or toxic-appearing.  HENT:     Head: Normocephalic and atraumatic.     Mouth/Throat:     Mouth: Mucous membranes are moist.     Pharynx: No oropharyngeal exudate or posterior oropharyngeal erythema.     Comments: Swelling to the left side of the tongue.  Uvula midline.  No peritonsillar swelling. Eyes:     Extraocular Movements: Extraocular movements intact.     Conjunctiva/sclera: Conjunctivae normal.     Pupils: Pupils are equal, round, and reactive to light.  Cardiovascular:     Rate and Rhythm: Normal rate and regular rhythm.     Heart sounds: No murmur heard. Pulmonary:     Effort: Pulmonary effort is normal. No respiratory distress.     Breath sounds: Normal  breath sounds. No wheezing, rhonchi or rales.  Abdominal:     Palpations: Abdomen is soft.     Tenderness: There is no abdominal tenderness.  Musculoskeletal:        General: No swelling.     Cervical back: Normal range of motion and neck supple.  Skin:    General: Skin is warm and dry.     Capillary Refill: Capillary refill takes less than 2 seconds.  Neurological:     General: No focal deficit present.     Mental Status: He is alert and oriented to person, place, and time.  Psychiatric:        Mood and Affect: Mood normal.     ED Results / Procedures / Treatments   Labs (all labs ordered are listed, but only  abnormal results are displayed) Labs Reviewed  CBC WITH DIFFERENTIAL/PLATELET - Abnormal; Notable for the following components:      Result Value   Monocytes Absolute 1.1 (*)    All other components within normal limits  COMPREHENSIVE METABOLIC PANEL - Abnormal; Notable for the following components:   Potassium 3.0 (*)    Chloride 95 (*)    All other components within normal limits  RESP PANEL BY RT-PCR (RSV, FLU A&B, COVID)  RVPGX2  GROUP A STREP BY PCR    EKG None  Radiology CT Soft Tissue Neck W Contrast  Result Date: 01/02/2023 CLINICAL DATA:  Laryngeal edema EXAM: CT NECK WITH CONTRAST TECHNIQUE: Multidetector CT imaging of the neck was performed using the standard protocol following the bolus administration of intravenous contrast. RADIATION DOSE REDUCTION: This exam was performed according to the departmental dose-optimization program which includes automated exposure control, adjustment of the mA and/or kV according to patient size and/or use of iterative reconstruction technique. CONTRAST:  67m OMNIPAQUE IOHEXOL 300 MG/ML  SOLN COMPARISON:  None Available. FINDINGS: Pharynx and larynx: There is severe edema of the epiglottis, left aryepiglottic fold and vallecular, extending into the left parapharyngeal and submandibular space. No discrete fluid collection. There is moderate narrowing of the lower pharyngeal airway. Salivary glands: Inflammatory stranding adjacent to the left submandibular gland. The other salivary glands are normal. Thyroid: Normal. Lymph nodes: None enlarged or abnormal density. Vascular: Negative. Limited intracranial: Negative. Visualized orbits: Negative. Mastoids and visualized paranasal sinuses: Clear. Skeleton: No acute or aggressive process. Upper chest: Negative. Other: None. IMPRESSION: 1. Severe edema of the epiglottis, left aryepiglottic fold and vallecular, extending into the left parapharyngeal and submandibular space. This may indicate angioedema,  particularly given sudden onset and fact that the patient is on an ACE-inhibitor. Viral supraglottitis is a secondary consideration. 2. Moderate narrowing of the lower pharyngeal airway. These results were called by telephone at the time of interpretation on 01/02/2023 at 9:38 pm to provider SFredia Sorrow, who verbally acknowledged these results. Electronically Signed   By: KUlyses JarredM.D.   On: 01/02/2023 21:38    Procedures Procedures    Medications Ordered in ED Medications  cefTRIAXone (ROCEPHIN) 1 g in sodium chloride 0.9 % 100 mL IVPB (has no administration in time range)  diphenhydrAMINE (BENADRYL) injection 25 mg (25 mg Intramuscular Given 01/02/23 1924)  dexamethasone (DECADRON) injection 10 mg (10 mg Intramuscular Given 01/02/23 1924)  potassium chloride 10 mEq in 100 mL IVPB (0 mEq Intravenous Stopped 01/02/23 2218)  diphenhydrAMINE (BENADRYL) injection 25 mg (25 mg Intravenous Given 01/02/23 2039)  famotidine (PEPCID) IVPB 20 mg premix (0 mg Intravenous Stopped 01/02/23 2115)  methylPREDNISolone sodium succinate (SOLU-MEDROL) 125  mg/2 mL injection 125 mg (125 mg Intravenous Given 01/02/23 2039)  iohexol (OMNIPAQUE) 300 MG/ML solution 75 mL (75 mLs Intravenous Contrast Given 01/02/23 2041)    ED Course/ Medical Decision Making/ A&P                             Medical Decision Making Amount and/or Complexity of Data Reviewed Labs: ordered. Radiology: ordered.  Risk Prescription drug management. Decision regarding hospitalization.  Patient nontoxic no acute distress.  Posterior oropharynx is normal other than swelling to the left side of the tongue.  No real facial swelling.  CBC white count 8.3 hemoglobin 16.9 platelets 202.  Complete metabolic panel significant for potassium of 3.0.  Probably due to his blood pressure medicine.  Patient given 10 mEq of potassium early on IV.  Liver function test normal anion gap normal.  Renal function normal GFR greater than  60.  Patient was given Benadryl Solu-Medrol Pepcid with significant improvement while we are waiting for CT soft tissue neck.  Patient feels the swelling of the tongue is gone down significantly and it does appear to be the case but he still feels like it is a little bit fat.  Patient still complaining of discomfort with swallowing to the left side of the throat.  CT soft tissue neck with some significant findings, discussed with the radiologist.  Severe edema of the epiglottis left area epiglottic fold and vallecular area extending into the left parapharyngeal and submandibular space.  This may indicate angioedema since patient is on an ACE inhibitor.  Viral epiglottitis is secondary consideration moderate narrowing of the lower pharyngeal airway.  Patient is still talking very well nontoxic.  Clinically my feeling was that this was angioedema secondary to ACE inhibitor because of the sudden onset and no fevers no toxicity normal white count.  However will give patient Rocephin to be safe.  Recommend admission and observation.  No concerns or needs for intubation at this point in time.  Patient is moving air well and actually feels as if the swelling is going down.  Have ordered strep test and respiratory panel.  Will discuss with hospitalist for admission.  Final Clinical Impression(s) / ED Diagnoses Final diagnoses:  Angioedema, initial encounter    Rx / DC Orders ED Discharge Orders     None         Fredia Sorrow, MD 01/02/23 2231

## 2023-01-03 ENCOUNTER — Encounter (HOSPITAL_COMMUNITY): Payer: Self-pay | Admitting: Internal Medicine

## 2023-01-03 DIAGNOSIS — Z79899 Other long term (current) drug therapy: Secondary | ICD-10-CM | POA: Diagnosis not present

## 2023-01-03 DIAGNOSIS — T464X5A Adverse effect of angiotensin-converting-enzyme inhibitors, initial encounter: Secondary | ICD-10-CM | POA: Diagnosis not present

## 2023-01-03 DIAGNOSIS — T783XXD Angioneurotic edema, subsequent encounter: Secondary | ICD-10-CM | POA: Diagnosis not present

## 2023-01-03 DIAGNOSIS — Z7982 Long term (current) use of aspirin: Secondary | ICD-10-CM | POA: Diagnosis not present

## 2023-01-03 DIAGNOSIS — E876 Hypokalemia: Secondary | ICD-10-CM | POA: Insufficient documentation

## 2023-01-03 DIAGNOSIS — Z6831 Body mass index (BMI) 31.0-31.9, adult: Secondary | ICD-10-CM | POA: Diagnosis not present

## 2023-01-03 DIAGNOSIS — E669 Obesity, unspecified: Secondary | ICD-10-CM | POA: Diagnosis not present

## 2023-01-03 DIAGNOSIS — F17200 Nicotine dependence, unspecified, uncomplicated: Secondary | ICD-10-CM | POA: Insufficient documentation

## 2023-01-03 DIAGNOSIS — Z1152 Encounter for screening for COVID-19: Secondary | ICD-10-CM | POA: Diagnosis not present

## 2023-01-03 DIAGNOSIS — T783XXA Angioneurotic edema, initial encounter: Secondary | ICD-10-CM | POA: Diagnosis not present

## 2023-01-03 DIAGNOSIS — E785 Hyperlipidemia, unspecified: Secondary | ICD-10-CM | POA: Insufficient documentation

## 2023-01-03 DIAGNOSIS — Z66 Do not resuscitate: Secondary | ICD-10-CM | POA: Diagnosis not present

## 2023-01-03 DIAGNOSIS — F17210 Nicotine dependence, cigarettes, uncomplicated: Secondary | ICD-10-CM | POA: Diagnosis not present

## 2023-01-03 DIAGNOSIS — J449 Chronic obstructive pulmonary disease, unspecified: Secondary | ICD-10-CM | POA: Insufficient documentation

## 2023-01-03 DIAGNOSIS — F1010 Alcohol abuse, uncomplicated: Secondary | ICD-10-CM | POA: Diagnosis present

## 2023-01-03 DIAGNOSIS — I10 Essential (primary) hypertension: Secondary | ICD-10-CM | POA: Diagnosis not present

## 2023-01-03 DIAGNOSIS — Z7951 Long term (current) use of inhaled steroids: Secondary | ICD-10-CM | POA: Diagnosis not present

## 2023-01-03 DIAGNOSIS — R69 Illness, unspecified: Secondary | ICD-10-CM | POA: Diagnosis not present

## 2023-01-03 DIAGNOSIS — N179 Acute kidney failure, unspecified: Secondary | ICD-10-CM | POA: Diagnosis not present

## 2023-01-03 LAB — CBC WITH DIFFERENTIAL/PLATELET
Abs Immature Granulocytes: 0.02 10*3/uL (ref 0.00–0.07)
Basophils Absolute: 0 10*3/uL (ref 0.0–0.1)
Basophils Relative: 0 %
Eosinophils Absolute: 0 10*3/uL (ref 0.0–0.5)
Eosinophils Relative: 0 %
HCT: 44 % (ref 39.0–52.0)
Hemoglobin: 15.2 g/dL (ref 13.0–17.0)
Immature Granulocytes: 0 %
Lymphocytes Relative: 11 %
Lymphs Abs: 0.7 10*3/uL (ref 0.7–4.0)
MCH: 32.8 pg (ref 26.0–34.0)
MCHC: 34.5 g/dL (ref 30.0–36.0)
MCV: 95 fL (ref 80.0–100.0)
Monocytes Absolute: 0.1 10*3/uL (ref 0.1–1.0)
Monocytes Relative: 1 %
Neutro Abs: 5.4 10*3/uL (ref 1.7–7.7)
Neutrophils Relative %: 88 %
Platelets: 161 10*3/uL (ref 150–400)
RBC: 4.63 MIL/uL (ref 4.22–5.81)
RDW: 13.6 % (ref 11.5–15.5)
WBC: 6.1 10*3/uL (ref 4.0–10.5)
nRBC: 0 % (ref 0.0–0.2)

## 2023-01-03 LAB — COMPREHENSIVE METABOLIC PANEL
ALT: 25 U/L (ref 0–44)
AST: 28 U/L (ref 15–41)
Albumin: 3.4 g/dL — ABNORMAL LOW (ref 3.5–5.0)
Alkaline Phosphatase: 51 U/L (ref 38–126)
Anion gap: 12 (ref 5–15)
BUN: 15 mg/dL (ref 8–23)
CO2: 21 mmol/L — ABNORMAL LOW (ref 22–32)
Calcium: 8.7 mg/dL — ABNORMAL LOW (ref 8.9–10.3)
Chloride: 98 mmol/L (ref 98–111)
Creatinine, Ser: 1.14 mg/dL (ref 0.61–1.24)
GFR, Estimated: >60 mL/min (ref >60)
Glucose, Bld: 169 mg/dL — ABNORMAL HIGH (ref 70–99)
Potassium: 4.2 mmol/L (ref 3.5–5.1)
Sodium: 131 mmol/L — ABNORMAL LOW (ref 135–145)
Total Bilirubin: 0.8 mg/dL (ref 0.3–1.2)
Total Protein: 6.2 g/dL — ABNORMAL LOW (ref 6.5–8.1)

## 2023-01-03 LAB — MAGNESIUM: Magnesium: 1.5 mg/dL — ABNORMAL LOW (ref 1.7–2.4)

## 2023-01-03 LAB — MRSA NEXT GEN BY PCR, NASAL: MRSA by PCR Next Gen: NOT DETECTED

## 2023-01-03 MED ORDER — ADULT MULTIVITAMIN W/MINERALS CH
1.0000 | ORAL_TABLET | Freq: Every day | ORAL | Status: DC
  Administered 2023-01-03 – 2023-01-05 (×3): 1 via ORAL
  Filled 2023-01-03 (×3): qty 1

## 2023-01-03 MED ORDER — NICOTINE 14 MG/24HR TD PT24
14.0000 mg | MEDICATED_PATCH | Freq: Every day | TRANSDERMAL | Status: DC
  Filled 2023-01-03 (×2): qty 1

## 2023-01-03 MED ORDER — AMLODIPINE BESYLATE 5 MG PO TABS
5.0000 mg | ORAL_TABLET | Freq: Every day | ORAL | Status: DC
  Administered 2023-01-03 – 2023-01-05 (×3): 5 mg via ORAL
  Filled 2023-01-03 (×3): qty 1

## 2023-01-03 MED ORDER — MAGNESIUM SULFATE 2 GM/50ML IV SOLN
2.0000 g | Freq: Once | INTRAVENOUS | Status: AC
  Administered 2023-01-03: 2 g via INTRAVENOUS
  Filled 2023-01-03: qty 50

## 2023-01-03 MED ORDER — MELATONIN 3 MG PO TABS
6.0000 mg | ORAL_TABLET | Freq: Every day | ORAL | Status: DC
  Administered 2023-01-04: 6 mg via ORAL
  Filled 2023-01-03: qty 2

## 2023-01-03 MED ORDER — ENOXAPARIN SODIUM 40 MG/0.4ML IJ SOSY
40.0000 mg | PREFILLED_SYRINGE | INTRAMUSCULAR | Status: DC
  Administered 2023-01-04: 40 mg via SUBCUTANEOUS
  Filled 2023-01-03: qty 0.4

## 2023-01-03 MED ORDER — HYDRALAZINE HCL 20 MG/ML IJ SOLN: 10.0000 mg | Freq: Three times a day (TID) | INTRAMUSCULAR | Status: DC | PRN

## 2023-01-03 MED ORDER — LORAZEPAM 2 MG/ML IJ SOLN
1.0000 mg | INTRAMUSCULAR | Status: DC | PRN
  Administered 2023-01-04: 1 mg via INTRAVENOUS
  Filled 2023-01-03: qty 1

## 2023-01-03 MED ORDER — THIAMINE MONONITRATE 100 MG PO TABS
100.0000 mg | ORAL_TABLET | Freq: Every day | ORAL | Status: DC
  Administered 2023-01-03 – 2023-01-05 (×3): 100 mg via ORAL
  Filled 2023-01-03 (×3): qty 1

## 2023-01-03 MED ORDER — THIAMINE HCL 100 MG/ML IJ SOLN: 100.0000 mg | Freq: Every day | INTRAMUSCULAR | Status: DC

## 2023-01-03 MED ORDER — FOLIC ACID 1 MG PO TABS
1.0000 mg | ORAL_TABLET | Freq: Every day | ORAL | Status: DC
  Administered 2023-01-03 – 2023-01-05 (×3): 1 mg via ORAL
  Filled 2023-01-03 (×3): qty 1

## 2023-01-03 MED ORDER — CHLORHEXIDINE GLUCONATE CLOTH 2 % EX PADS
6.0000 | MEDICATED_PAD | Freq: Every day | CUTANEOUS | Status: DC
  Administered 2023-01-03 – 2023-01-04 (×2): 6 via TOPICAL

## 2023-01-03 MED ORDER — LORAZEPAM 1 MG PO TABS: 1.0000 mg | ORAL_TABLET | ORAL | Status: DC | PRN

## 2023-01-03 NOTE — Assessment & Plan Note (Signed)
-  Smokes half a pack per day - Casually trying to quit - Nicotine patch ordered

## 2023-01-03 NOTE — Progress Notes (Signed)
PROGRESS NOTE  Raymond Nelson [id number] DOB: Jul 11, 1951 DOA: 01/02/2023 PCP: [facility]  HPI/Recap of past 24 hours: Raymond Nelson Raymond Nelson is a 72 y.o. male with medical history significant of hypertension, hyperlipidemia, COPD, tobacco use disorder, presents the ED with a chief complaint of tongue and neck swelling X 1 day. Patient reports he was in his normal state of health up until this started.Then he noticed difficulty with speech. He has been on lisinopril for years and has been tolerating it without any issues. Patient admitted for further management.   Today, pt reported feeling much better, but still reports some discomfort in his throat and with swallowing.    Assessment/Plan: Principal Problem:   Angioedema Active Problems:   Hypokalemia   Essential hypertension   COPD (chronic obstructive pulmonary disease) (HCC)   Tobacco use disorder   Hyperlipidemia   Angioedema Improving Possibly from lisinopril, unlikely an infectious origin CT scan shows severe edema of the epiglottis with left aryepiglottic fold and vallecular extending to left parapharyngeal and submandibular space indicative angioedema Continue Solu-Medrol, Benadryl scheduled, and Pepcid Continue to monitor in stepdown   Hyperlipidemia Continue statin   COPD (chronic obstructive pulmonary disease) (HCC) Continue Breo and Incruse No clinical evidence of exacerbation   Essential hypertension Discontinue ACE indefinitely- should be placed on allergy list Continue hydrochlorothiazide, start amlodipine, prn IV hydralazine   Hypomagnesemia Replace prn  Obesity Lifestyle modification advised  Tobacco abuse Lifestyle modification advised    Estimated body mass index is 31.09 kg/m as calculated from the following:   Height as of this encounter: '5\' 9"'$  (1.753 m).   Weight as of this encounter: 95.5 kg.     Code Status: DNR  Family Communication: None at bedside  Disposition  Plan: Status is: Observation The patient remains OBS appropriate and will d/c before 2 midnights.      Consultants: None  Procedures: None  Antimicrobials: None  DVT prophylaxis: Lovenox   Objective: Vitals:   01/03/23 0800 01/03/23 0822 01/03/23 1000 01/03/23 1236  BP: (!) 146/103  (!) 143/82   Pulse: (!) 57  73   Resp: 18  16   Temp:  98.2 F (36.8 C) 97.9 F (36.6 C)   TempSrc:  Oral Oral   SpO2: 94%  99%   Weight:    95.5 kg  Height:    '5\' 9"'$  (1.753 m)    Intake/Output Summary (Last 24 hours) at 01/03/2023 1239 Last data filed at 01/03/2023 1043 Gross per 24 hour  Intake 646.47 ml  Output --  Net 646.47 ml   Filed Weights   01/02/23 1844 01/03/23 1236  Weight: 93 kg 95.5 kg    Exam: General: NAD  Cardiovascular: S1, S2 present Respiratory: CTAB Abdomen: Soft, nontender, nondistended, bowel sounds present Musculoskeletal: No bilateral pedal edema noted Skin: Normal Psychiatry: Normal mood     Data Reviewed: CBC: Recent Labs  Lab 01/02/23 1926 01/03/23 0438  WBC 8.3 6.1  NEUTROABS 4.7 5.4  HGB 16.9 15.2  HCT 49.7 44.0  MCV 95.9 95.0  PLT 202 016   Basic Metabolic Panel: Recent Labs  Lab 01/02/23 1926 01/03/23 0438  NA 136 131*  K 3.0* 4.2  CL 95* 98  CO2 27 21*  GLUCOSE 84 169*  BUN 13 15  CREATININE 1.12 1.14  CALCIUM 9.8 8.7*  MG  --  1.5*   GFR: Estimated Creatinine Clearance: 67.8 mL/min (by C-G formula based on SCr of 1.14 mg/dL). Liver Function Tests: Recent Labs  Lab 01/02/23 1926 01/03/23 0438  AST 31 28  ALT 28 25  ALKPHOS 60 51  BILITOT 0.7 0.8  PROT 7.2 6.2*  ALBUMIN 4.1 3.4*   No results for input(s): "LIPASE", "AMYLASE" in the last 168 hours. No results for input(s): "AMMONIA" in the last 168 hours. Coagulation Profile: No results for input(s): "INR", "PROTIME" in the last 168 hours. Cardiac Enzymes: No results for input(s): "CKTOTAL", "CKMB", "CKMBINDEX", "TROPONINI" in the last 168 hours. BNP (last  3 results) No results for input(s): "PROBNP" in the last 8760 hours. HbA1C: No results for input(s): "HGBA1C" in the last 72 hours. CBG: No results for input(s): "GLUCAP" in the last 168 hours. Lipid Profile: No results for input(s): "CHOL", "HDL", "LDLCALC", "TRIG", "CHOLHDL", "LDLDIRECT" in the last 72 hours. Thyroid Function Tests: No results for input(s): "TSH", "T4TOTAL", "FREET4", "T3FREE", "THYROIDAB" in the last 72 hours. Anemia Panel: No results for input(s): "VITAMINB12", "FOLATE", "FERRITIN", "TIBC", "IRON", "RETICCTPCT" in the last 72 hours. Urine analysis:    Component Value Date/Time   COLORURINE STRAW (A) 03/17/2020 1031   APPEARANCEUR CLEAR 03/17/2020 1031   LABSPEC 1.008 03/17/2020 1031   PHURINE 5.0 03/17/2020 1031   GLUCOSEU NEGATIVE 03/17/2020 1031   HGBUR NEGATIVE 03/17/2020 1031   BILIRUBINUR NEGATIVE 03/17/2020 1031   KETONESUR NEGATIVE 03/17/2020 1031   PROTEINUR NEGATIVE 03/17/2020 1031   NITRITE NEGATIVE 03/17/2020 1031   LEUKOCYTESUR NEGATIVE 03/17/2020 1031   Sepsis Labs: '@LABRCNTIP'$ (procalcitonin:4,lacticidven:4)  ) Recent Results (from the past 240 hour(s))  Resp panel by RT-PCR (RSV, Flu A&B, Covid) Anterior Nasal Swab     Status: None   Collection Time: 01/02/23 10:15 PM   Specimen: Anterior Nasal Swab  Result Value Ref Range Status   SARS Coronavirus 2 by RT PCR NEGATIVE NEGATIVE Final    Comment: (NOTE) SARS-CoV-2 target nucleic acids are NOT DETECTED.  The SARS-CoV-2 RNA is generally detectable in upper respiratory specimens during the acute phase of infection. The lowest concentration of SARS-CoV-2 viral copies this assay can detect is 138 copies/mL. A negative result does not preclude SARS-Cov-2 infection and should not be used as the sole basis for treatment or other patient management decisions. A negative result may occur with  improper specimen collection/handling, submission of specimen other than nasopharyngeal swab, presence of  viral mutation(s) within the areas targeted by this assay, and inadequate number of viral copies(<138 copies/mL). A negative result must be combined with clinical observations, patient history, and epidemiological information. The expected result is Negative.  Fact Sheet for Patients:  EntrepreneurPulse.com.au  Fact Sheet for Healthcare Providers:  IncredibleEmployment.be  This test is no t yet approved or cleared by the [place] FDA and  has been authorized for detection and/or diagnosis of SARS-CoV-2 by FDA under an Emergency Use Authorization (EUA). This EUA will remain  in effect (meaning this test can be used) for the duration of the COVID-19 declaration under Section 564(b)(1) of the Act, 21 U.S.C.section 360bbb-3(b)(1), unless the authorization is terminated  or revoked sooner.       Influenza A by PCR NEGATIVE NEGATIVE Final   Influenza B by PCR NEGATIVE NEGATIVE Final    Comment: (NOTE) The Xpert Xpress SARS-CoV-2/FLU/RSV plus assay is intended as an aid in the diagnosis of influenza from Nasopharyngeal swab specimens and should not be used as a sole basis for treatment. Nasal washings and aspirates are unacceptable for Xpert Xpress SARS-CoV-2/FLU/RSV testing.  Fact Sheet for Patients: EntrepreneurPulse.com.au  Fact Sheet for Healthcare Providers: IncredibleEmployment.be  This test is  not yet approved or cleared by the [place] and has been authorized for detection and/or diagnosis of SARS-CoV-2 by FDA under an Emergency Use Authorization (EUA). This EUA will remain in effect (meaning this test can be used) for the duration of the COVID-19 declaration under Section 564(b)(1) of the Act, 21 U.S.C. section 360bbb-3(b)(1), unless the authorization is terminated or revoked.     Resp Syncytial Virus by PCR NEGATIVE NEGATIVE Final    Comment: (NOTE) Fact Sheet for  Patients: EntrepreneurPulse.com.au  Fact Sheet for Healthcare Providers: IncredibleEmployment.be  This test is not yet approved or cleared by the [place] FDA and has been authorized for detection and/or diagnosis of SARS-CoV-2 by FDA under an Emergency Use Authorization (EUA). This EUA will remain in effect (meaning this test can be used) for the duration of the COVID-19 declaration under Section 564(b)(1) of the Act, 21 U.S.C. section 360bbb-3(b)(1), unless the authorization is terminated or revoked.  Performed at [facility], [address], [place], [place] [address]   Group A Strep by PCR     Status: None   Collection Time: 01/02/23 10:15 PM   Specimen: Anterior Nasal Swab; Sterile Swab  Result Value Ref Range Status   Group A Strep by PCR NOT DETECTED NOT DETECTED Final    Comment: Performed at [facility], [address] [place], [place] [address]      Studies: CT Soft Tissue Neck W Contrast  Result Date: 01/02/2023 CLINICAL DATA:  Laryngeal edema EXAM: CT NECK WITH CONTRAST TECHNIQUE: Multidetector CT imaging of the neck was performed using the standard protocol following the bolus administration of intravenous contrast. RADIATION DOSE REDUCTION: This exam was performed according to the departmental dose-optimization program which includes automated exposure control, adjustment of the mA and/or kV according to patient size and/or use of iterative reconstruction technique. CONTRAST:  42m OMNIPAQUE IOHEXOL 300 MG/ML  SOLN COMPARISON:  None Available. FINDINGS: Pharynx and larynx: There is severe edema of the epiglottis, left aryepiglottic fold and vallecular, extending into the left parapharyngeal and submandibular space. No discrete fluid collection. There is moderate narrowing of the lower pharyngeal airway. Salivary glands: Inflammatory stranding adjacent to the left submandibular gland. The other salivary glands are normal. Thyroid:  Normal. Lymph nodes: None enlarged or abnormal density. Vascular: Negative. Limited intracranial: Negative. Visualized orbits: Negative. Mastoids and visualized paranasal sinuses: Clear. Skeleton: No acute or aggressive process. Upper chest: Negative. Other: None. IMPRESSION: 1. Severe edema of the epiglottis, left aryepiglottic fold and vallecular, extending into the left parapharyngeal and submandibular space. This may indicate angioedema, particularly given sudden onset and fact that the patient is on an ACE-inhibitor. Viral supraglottitis is a secondary consideration. 2. Moderate narrowing of the lower pharyngeal airway. These results were called by telephone at the time of interpretation on 01/02/2023 at 9:38 pm to provider SFredia Sorrow, who verbally acknowledged these results. Electronically Signed   By: KUlyses JarredM.D.   On: 01/02/2023 21:38    Scheduled Meds:  aspirin EC  81 mg Oral Daily   atorvastatin  20 mg Oral Daily   Chlorhexidine Gluconate Cloth  6 each Topical Daily   diphenhydrAMINE  25 mg Intravenous Q6H   fluticasone furoate-vilanterol  1 puff Inhalation Daily   And   umeclidinium bromide  1 puff Inhalation Daily   heparin  5,000 Units Subcutaneous Q8H   hydrochlorothiazide  25 mg Oral Daily   methylPREDNISolone (SOLU-MEDROL) injection  125 mg Intravenous BID   nicotine  14 mg Transdermal  Daily    Continuous Infusions:  famotidine (PEPCID) IV Stopped (01/03/23 1043)     LOS: 0 days     Alma Friendly, MD [facility]  If 7PM-7AM, please contact night-coverage [email or web address] 01/03/2023, 12:39 PM

## 2023-01-03 NOTE — Assessment & Plan Note (Signed)
-  Holding ACE indefinitely - Continue hydrochlorothiazide

## 2023-01-03 NOTE — Assessment & Plan Note (Signed)
-  Continue Breo and Incruse - No clinical evidence of exacerbation

## 2023-01-03 NOTE — Progress Notes (Addendum)
  Transition of Care [facility]) Screening Note   Patient Details  Name: Raymond Nelson Date of Birth: 1951-11-10   Transition of Care [facility]) CM/SW Contact:    Iona Beard, [place] Phone Number: 01/03/2023, 9:16 AM  TOC consult for substance use resources. Substance use resources have been added to pts AVS for print at D/C.   Transition of Care Department [facility]) has reviewed patient and no TOC needs have been identified at this time. We will continue to monitor patient advancement through interdisciplinary progression rounds. If new patient transition needs arise, please place a TOC consult.

## 2023-01-03 NOTE — Progress Notes (Addendum)
Pts daughter states that pt drinks daily about 3-4 "airplane bottles" daily or a half pint of hard liquor. Pts daughter states that pt thought he saw a bug on the over bed table that wasn't there, also saw a piece of plastic in his ice cream that wasn't there. Upon initial assessment of pt, pt does not appear to be having any alcohol withdraw symptoms. Will monitor closely.  Also pts daughter states that pt may be upset that they informed staff of this, [place] done and score is 0.

## 2023-01-03 NOTE — Assessment & Plan Note (Signed)
Continue statin. 

## 2023-01-03 NOTE — Progress Notes (Signed)
Pt able to tolerate soft diet for dinner without difficulty. Will continue to monitor.

## 2023-01-03 NOTE — ED Notes (Signed)
Attempted to call report to ICU. Per ICU, receiving another pt from ED and then will call back for report on this patient.

## 2023-01-03 NOTE — Assessment & Plan Note (Signed)
-  50 mEq of potassium given in the ED - Initial potassium was 3.0 - Continue to monitor

## 2023-01-03 NOTE — H&P (Signed)
History and Physical    Patient: Raymond Nelson [id number] DOB: 26-Sep-1951 DOA: 01/02/2023 DOS: the patient was seen and examined on 01/03/2023 PCP: [facility]  Patient coming from: Home  Chief Complaint:  Chief Complaint  Patient presents with   Oral Swelling   HPI: Raymond Nelson is a 72 y.o. male with medical history significant of hypertension, hyperlipidemia, COPD, tobacco use disorder, presents the ED with a chief complaint of tongue and neck swelling.  Patient reports the swelling started acutely at 4 PM.  He was in his normal state of health up until this started.  He reports the first thing he noticed was that his throat was sore.  Then he noticed difficulty with speech.  Then he noticed the left side of his tongue was swollen.  He had no change in his voice.  No hot potato voice.  He denies dyspnea, chest pain, syncope/near syncope.  He has been on lisinopril for years.  His last dose was yesterday morning.  This is never happened to him before.  He has no other complaints at this time.  After specifically clarifying, patient would want to be DNR/DNI. Review of Systems: As mentioned in the history of present illness. All other systems reviewed and are negative. Past Medical History:  Diagnosis Date   Hypertension    History reviewed. No pertinent surgical history. Social History:  reports that he has been smoking cigarettes. He has been smoking an average of .5 packs per day. He has never used smokeless tobacco. He reports current alcohol use. He reports that he does not use drugs.  No Known Allergies  History reviewed. No pertinent family history.  Prior to Admission medications   Medication Sig Start Date End Date Taking? Authorizing Provider  aspirin EC 81 MG tablet Take 81 mg by mouth daily. Swallow whole.   Yes [provider]  atorvastatin (LIPITOR) 20 MG tablet Take 20 mg by mouth daily. 10/05/22  Yes [provider]  guaiFENesin  (MUCINEX) 600 MG 12 hr tablet Take 600 mg by mouth 2 (two) times daily.   Yes [provider]  lisinopril-hydrochlorothiazide (ZESTORETIC) 20-25 MG tablet Take 1 tablet by mouth daily. 11/13/22  Yes [provider]  Donnal Debar 200-62.5-25 MCG/ACT AEPB Take 1 puff by mouth daily. 10/26/22  Yes [provider]    Physical Exam: Vitals:   01/03/23 0400 01/03/23 0430 01/03/23 0504 01/03/23 0600  BP: (!) 144/93 (!) 145/90 (!) 143/99 (!) 145/94  Pulse: 63 62 68 63  Resp: '14 13 19 13  '$ Temp:  98.1 F (36.7 C)    TempSrc:      SpO2: 92% 92% 94% 96%  Weight:      Height:       1.  General: Patient lying supine in bed,  no acute distress   2. Psychiatric: Alert and oriented x 3, mood and behavior normal for situation, pleasant and cooperative with exam   3. Neurologic: Speech and language are normal, face is symmetric, moves all 4 extremities voluntarily, at baseline without acute deficits on limited exam   4. HEENMT:  Head is atraumatic, normocephalic, pupils reactive to light, neck is supple, trachea is midline, mucous membranes are moist, cervical lymphadenopathy, Mallampati score 2-3  5. Respiratory : Lungs are clear to auscultation bilaterally without wheezing, rhonchi, rales, no cyanosis, no increase in work of breathing or accessory muscle use   6. Cardiovascular : Heart rate normal, rhythm is regular, no murmurs, rubs or  gallops, no peripheral edema, peripheral pulses palpated   7. Gastrointestinal:  Abdomen is soft, nondistended, nontender to palpation bowel sounds active, no masses or organomegaly palpated   8. Skin:  Skin is warm, dry and intact without rashes, acute lesions, or ulcers on limited exam   9.Musculoskeletal:  No acute deformities or trauma, no asymmetry in tone, no peripheral edema, peripheral pulses palpated, no tenderness to palpation in the extremities  Data Reviewed: In the ED Temp 98.4, heart rate 66/80, respiratory  rate 18, blood pressure 148/93-163/111, satting 95-96% Leukocytosis 8.3 Chemistries unremarkable UA is not indicative of UTI CT soft tissue neck shows severe edema of epiglottis, left area epiglottic fold and vallecular extending to left parapharyngeal and left submandibular space, angioedema.  Rocephin, dexamethasone, Benadryl x 2, Pepcid given in the ED - 10 mEq potassium given in the ED - Admission requested for hypokalemia and angioedema  Assessment and Plan: * Angioedema - With left-sided tongue swelling and left neck swelling - CT scan shows severe edema of the epiglottis with left aryepiglottic fold and vallecular extending to left parapharyngeal and submandibular space indicative angioedema - Moderate narrowing of lower pharyngeal airway - Patient was treated with dexamethasone, Benadryl 25 mg x 2, Pepcid in the ED - Continue Solu-Medrol, Benadryl scheduled, and Pepcid - Patient is improving and will not likely require ventilator for airway protection - Monitor on stepdown  Hyperlipidemia - Continue statin  COPD (chronic obstructive pulmonary disease) ([place]) - Continue Breo and Incruse - No clinical evidence of exacerbation  Essential hypertension - Holding ACE indefinitely - Continue hydrochlorothiazide  Hypokalemia - 50 mEq of potassium given in the ED - Initial potassium was 3.0 - Continue to monitor      Advance Care Planning:   Code Status: Full Code  Consults: None at this time  Family Communication: No family at bedside  Severity of Illness: The appropriate patient status for this patient is OBSERVATION. Observation status is judged to be reasonable and necessary in order to provide the required intensity of service to ensure the patient's safety. The patient's presenting symptoms, physical exam findings, and initial radiographic and laboratory data in the context of their medical condition is felt to place them at decreased risk for further clinical  deterioration. Furthermore, it is anticipated that the patient will be medically stable for discharge from the hospital within 2 midnights of admission.   Author: Rolla Plate, DO 01/03/2023 6:36 AM  For on call review [email or web address].

## 2023-01-03 NOTE — Assessment & Plan Note (Signed)
-  With left-sided tongue swelling and left neck swelling - CT scan shows severe edema of the epiglottis with left aryepiglottic fold and vallecular extending to left parapharyngeal and submandibular space indicative angioedema - Moderate narrowing of lower pharyngeal airway - Patient was treated with dexamethasone, Benadryl 25 mg x 2, Pepcid in the ED - Continue Solu-Medrol, Benadryl scheduled, and Pepcid - Patient is improving and will not likely require ventilator for airway protection - Monitor on stepdown

## 2023-01-04 DIAGNOSIS — F17200 Nicotine dependence, unspecified, uncomplicated: Secondary | ICD-10-CM | POA: Diagnosis not present

## 2023-01-04 DIAGNOSIS — T783XXA Angioneurotic edema, initial encounter: Secondary | ICD-10-CM | POA: Diagnosis not present

## 2023-01-04 DIAGNOSIS — E876 Hypokalemia: Secondary | ICD-10-CM | POA: Diagnosis not present

## 2023-01-04 DIAGNOSIS — I10 Essential (primary) hypertension: Secondary | ICD-10-CM | POA: Diagnosis not present

## 2023-01-04 DIAGNOSIS — N179 Acute kidney failure, unspecified: Secondary | ICD-10-CM

## 2023-01-04 LAB — BASIC METABOLIC PANEL
Anion gap: 4 — ABNORMAL LOW (ref 5–15)
Anion gap: 9 (ref 5–15)
BUN: 22 mg/dL (ref 8–23)
BUN: 24 mg/dL — ABNORMAL HIGH (ref 8–23)
CO2: 25 mmol/L (ref 22–32)
CO2: 25 mmol/L (ref 22–32)
Calcium: 8.4 mg/dL — ABNORMAL LOW (ref 8.9–10.3)
Calcium: 8.6 mg/dL — ABNORMAL LOW (ref 8.9–10.3)
Chloride: 99 mmol/L (ref 98–111)
Chloride: 101 mmol/L (ref 98–111)
Creatinine, Ser: 1.3 mg/dL — ABNORMAL HIGH (ref 0.61–1.24)
Creatinine, Ser: 1.53 mg/dL — ABNORMAL HIGH (ref 0.61–1.24)
GFR, Estimated: 59 mL/min — ABNORMAL LOW (ref >60)
GFR, Estimated: 48 mL/min — ABNORMAL LOW (ref >60)
Glucose, Bld: 166 mg/dL — ABNORMAL HIGH (ref 70–99)
Glucose, Bld: 175 mg/dL — ABNORMAL HIGH (ref 70–99)
Potassium: 3.5 mmol/L (ref 3.5–5.1)
Potassium: 3.5 mmol/L (ref 3.5–5.1)
Sodium: 128 mmol/L — ABNORMAL LOW (ref 135–145)
Sodium: 135 mmol/L (ref 135–145)

## 2023-01-04 LAB — MAGNESIUM: Magnesium: 2 mg/dL (ref 1.7–2.4)

## 2023-01-04 MED ORDER — SODIUM CHLORIDE 0.9 % IV SOLN
INTRAVENOUS | Status: DC
  Administered 2023-01-04: 100 mL/h via INTRAVENOUS

## 2023-01-04 MED ORDER — DIPHENHYDRAMINE HCL 25 MG PO CAPS
25.0000 mg | ORAL_CAPSULE | Freq: Three times a day (TID) | ORAL | Status: DC
  Administered 2023-01-04 – 2023-01-05 (×3): 25 mg via ORAL
  Filled 2023-01-04 (×3): qty 1

## 2023-01-04 MED ORDER — PREDNISONE 20 MG PO TABS
50.0000 mg | ORAL_TABLET | Freq: Every day | ORAL | Status: DC
  Administered 2023-01-05: 50 mg via ORAL
  Filled 2023-01-04: qty 1

## 2023-01-04 NOTE — Care Management Important Message (Signed)
Important Message  Patient Details  Name: Raymond Nelson MRN: [id number] Date of Birth: [birth year]   Medicare Important Message Given:  Yes     Tommy Medal 01/04/2023, 11:04 AM

## 2023-01-04 NOTE — Progress Notes (Signed)
PROGRESS NOTE  Raymond Nelson ENI:[id number] DOB: 06-02-1951 DOA: 01/02/2023 PCP: [facility]  HPI/Recap of past 24 hours: Raymond Nelson is a 72 y.o. male with medical history significant of hypertension, hyperlipidemia, COPD, tobacco use disorder, presents the ED with a chief complaint of tongue and neck swelling X 1 day. Patient reports he was in his normal state of health up until this started.Then he noticed difficulty with speech. He has been on lisinopril for years and has been tolerating it without any issues. Patient admitted for further management.   Today, pt reported feeling better, was able to tolerate his diet, talking and breathing comfortably.  Noted a bump in creatinine, possibly could be due to HCTZ.  Repeated another BMP after brief hydration and noted to bump even more.  Discussed with patient to stay overnight to get hydrated and recheck BMP in the morning.  Patient not eager to be in the hospital as there is some concerns for alcohol abuse.    Assessment/Plan: Principal Problem:   Angioedema Active Problems:   Hypokalemia   Essential hypertension   COPD (chronic obstructive pulmonary disease) (HCC)   Tobacco use disorder   Hyperlipidemia   Angioedema Improving Possibly from lisinopril, unlikely an infectious origin CT scan shows severe edema of the epiglottis with left aryepiglottic fold and vallecular extending to left parapharyngeal and submandibular space indicative angioedema S/p Solu-Medrol, switch to p.o. prednisone, p.o. Benadryl scheduled, and Pepcid Continue to monitor in stepdown  AKI Creatinine bumped to 1.5 from baseline of WNL Possibly from HCTZ use Held HCTZ Bladder scan does not show any urinary retention Continue IV fluids BMP in a.m.  Hyperlipidemia Continue statin   COPD (chronic obstructive pulmonary disease) (HCC) Continue Breo and Incruse No clinical evidence of exacerbation   Essential  hypertension Discontinue ACE indefinitely- should be placed on allergy list Held hydrochlorothiazide, continue amlodipine, prn IV hydralazine   Hypomagnesemia Replace prn  Obesity Lifestyle modification advised  Tobacco abuse Advised to quit Refused nicotine patch  Alcohol abuse Advised to quit CIWA protocol     Estimated body mass index is 31.09 kg/m as calculated from the following:   Height as of this encounter: '5\' 9"'$  (1.753 m).   Weight as of this encounter: 95.5 kg.     Code Status: DNR  Family Communication: None at bedside  Disposition Plan: Status is: Inpatient       Consultants: None  Procedures: None  Antimicrobials: None  DVT prophylaxis: Lovenox   Objective: Vitals:   01/04/23 1500 01/04/23 1600 01/04/23 1616 01/04/23 1700  BP: 118/64 120/70  121/72  Pulse: 70 69  63  Resp: '15 18  17  '$ Temp:   97.8 F (36.6 C)   TempSrc:   Oral   SpO2: 98% 97%  100%  Weight:      Height:        Intake/Output Summary (Last 24 hours) at 01/04/2023 1736 Last data filed at 01/04/2023 1610 Gross per 24 hour  Intake 712.32 ml  Output --  Net 712.32 ml   Filed Weights   01/02/23 1844 01/03/23 1236  Weight: 93 kg 95.5 kg    Exam: General: NAD  Cardiovascular: S1, S2 present Respiratory: CTAB Abdomen: Soft, nontender, nondistended, bowel sounds present Musculoskeletal: No bilateral pedal edema noted Skin: Normal Psychiatry: Normal mood     Data Reviewed: CBC: Recent Labs  Lab 01/02/23 1926 01/03/23 0438  WBC 8.3 6.1  NEUTROABS 4.7 5.4  HGB 16.9 15.2  HCT 49.7  44.0  MCV 95.9 95.0  PLT 202 540   Basic Metabolic Panel: Recent Labs  Lab 01/02/23 1926 01/03/23 0438 01/04/23 0442 01/04/23 1508  NA 136 131* 128* 135  K 3.0* 4.2 3.5 3.5  CL 95* 98 99 101  CO2 27 21* 25 25  GLUCOSE 84 169* 166* 175*  BUN '13 15 22 '$ 24*  CREATININE 1.12 1.14 1.30* 1.53*  CALCIUM 9.8 8.7* 8.4* 8.6*  MG  --  1.5* 2.0  --    GFR: Estimated Creatinine  Clearance: 50.5 mL/min (A) (by C-G formula based on SCr of 1.53 mg/dL (H)). Liver Function Tests: Recent Labs  Lab 01/02/23 1926 01/03/23 0438  AST 31 28  ALT 28 25  ALKPHOS 60 51  BILITOT 0.7 0.8  PROT 7.2 6.2*  ALBUMIN 4.1 3.4*   No results for input(s): "LIPASE", "AMYLASE" in the last 168 hours. No results for input(s): "AMMONIA" in the last 168 hours. Coagulation Profile: No results for input(s): "INR", "PROTIME" in the last 168 hours. Cardiac Enzymes: No results for input(s): "CKTOTAL", "CKMB", "CKMBINDEX", "TROPONINI" in the last 168 hours. BNP (last 3 results) No results for input(s): "PROBNP" in the last 8760 hours. HbA1C: No results for input(s): "HGBA1C" in the last 72 hours. CBG: No results for input(s): "GLUCAP" in the last 168 hours. Lipid Profile: No results for input(s): "CHOL", "HDL", "LDLCALC", "TRIG", "CHOLHDL", "LDLDIRECT" in the last 72 hours. Thyroid Function Tests: No results for input(s): "TSH", "T4TOTAL", "FREET4", "T3FREE", "THYROIDAB" in the last 72 hours. Anemia Panel: No results for input(s): "VITAMINB12", "FOLATE", "FERRITIN", "TIBC", "IRON", "RETICCTPCT" in the last 72 hours. Urine analysis:    Component Value Date/Time   COLORURINE STRAW (A) 03/17/2020 1031   APPEARANCEUR CLEAR 03/17/2020 1031   LABSPEC 1.008 03/17/2020 1031   PHURINE 5.0 03/17/2020 1031   GLUCOSEU NEGATIVE 03/17/2020 1031   HGBUR NEGATIVE 03/17/2020 1031   BILIRUBINUR NEGATIVE 03/17/2020 1031   KETONESUR NEGATIVE 03/17/2020 1031   PROTEINUR NEGATIVE 03/17/2020 1031   NITRITE NEGATIVE 03/17/2020 1031   LEUKOCYTESUR NEGATIVE 03/17/2020 1031   Sepsis Labs: '@LABRCNTIP'$ (procalcitonin:4,lacticidven:4)  ) Recent Results (from the past 240 hour(s))  Resp panel by RT-PCR (RSV, Flu A&B, Covid) Anterior Nasal Swab     Status: None   Collection Time: 01/02/23 10:15 PM   Specimen: Anterior Nasal Swab  Result Value Ref Range Status   SARS Coronavirus 2 by RT PCR NEGATIVE  NEGATIVE Final    Comment: (NOTE) SARS-CoV-2 target nucleic acids are NOT DETECTED.  The SARS-CoV-2 RNA is generally detectable in upper respiratory specimens during the acute phase of infection. The lowest concentration of SARS-CoV-2 viral copies this assay can detect is 138 copies/mL. A negative result does not preclude SARS-Cov-2 infection and should not be used as the sole basis for treatment or other patient management decisions. A negative result may occur with  improper specimen collection/handling, submission of specimen other than nasopharyngeal swab, presence of viral mutation(s) within the areas targeted by this assay, and inadequate number of viral copies(<138 copies/mL). A negative result must be combined with clinical observations, patient history, and epidemiological information. The expected result is Negative.  Fact Sheet for Patients:  EntrepreneurPulse.com.au  Fact Sheet for Healthcare Providers:  IncredibleEmployment.be  This test is no t yet approved or cleared by the [place] FDA and  has been authorized for detection and/or diagnosis of SARS-CoV-2 by FDA under an Emergency Use Authorization (EUA). This EUA will remain  in effect (meaning this test can be used) for the  duration of the COVID-19 declaration under Section 564(b)(1) of the Act, 21 U.S.C.section 360bbb-3(b)(1), unless the authorization is terminated  or revoked sooner.       Influenza A by PCR NEGATIVE NEGATIVE Final   Influenza B by PCR NEGATIVE NEGATIVE Final    Comment: (NOTE) The Xpert Xpress SARS-CoV-2/FLU/RSV plus assay is intended as an aid in the diagnosis of influenza from Nasopharyngeal swab specimens and should not be used as a sole basis for treatment. Nasal washings and aspirates are unacceptable for Xpert Xpress SARS-CoV-2/FLU/RSV testing.  Fact Sheet for Patients: EntrepreneurPulse.com.au  Fact Sheet for Healthcare  Providers: IncredibleEmployment.be  This test is not yet approved or cleared by the [place] FDA and has been authorized for detection and/or diagnosis of SARS-CoV-2 by FDA under an Emergency Use Authorization (EUA). This EUA will remain in effect (meaning this test can be used) for the duration of the COVID-19 declaration under Section 564(b)(1) of the Act, 21 U.S.C. section 360bbb-3(b)(1), unless the authorization is terminated or revoked.     Resp Syncytial Virus by PCR NEGATIVE NEGATIVE Final    Comment: (NOTE) Fact Sheet for Patients: EntrepreneurPulse.com.au  Fact Sheet for Healthcare Providers: IncredibleEmployment.be  This test is not yet approved or cleared by the [place] FDA and has been authorized for detection and/or diagnosis of SARS-CoV-2 by FDA under an Emergency Use Authorization (EUA). This EUA will remain in effect (meaning this test can be used) for the duration of the COVID-19 declaration under Section 564(b)(1) of the Act, 21 U.S.C. section 360bbb-3(b)(1), unless the authorization is terminated or revoked.  Performed at [facility], [address] [place], [place] [address]   Group A Strep by PCR     Status: None   Collection Time: 01/02/23 10:15 PM   Specimen: Anterior Nasal Swab; Sterile Swab  Result Value Ref Range Status   Group A Strep by PCR NOT DETECTED NOT DETECTED Final    Comment: Performed at [facility], [address] [place], [place] [address]  MRSA Next Gen by PCR, Nasal     Status: None   Collection Time: 01/03/23 10:12 AM   Specimen: Nasal Mucosa; Nasal Swab  Result Value Ref Range Status   MRSA by PCR Next Gen NOT DETECTED NOT DETECTED Final    Comment: (NOTE) The GeneXpert MRSA Assay (FDA approved for NASAL specimens only), is one component of a comprehensive MRSA colonization surveillance program. It is not intended to diagnose MRSA infection nor to guide or monitor  treatment for MRSA infections. Test performance is not FDA approved in patients less than 94 years old. Performed at [facility], [address], [place], [place] [address]       Studies: No results found.  Scheduled Meds:  amLODipine  5 mg Oral Daily   aspirin EC  81 mg Oral Daily   atorvastatin  20 mg Oral Daily   Chlorhexidine Gluconate Cloth  6 each Topical Daily   diphenhydrAMINE  25 mg Oral Q8H   enoxaparin (LOVENOX) injection  40 mg Subcutaneous Q24H   fluticasone furoate-vilanterol  1 puff Inhalation Daily   And   umeclidinium bromide  1 puff Inhalation Daily   folic acid  1 mg Oral Daily   melatonin  6 mg Oral QHS   multivitamin with minerals  1 tablet Oral Daily   nicotine  14 mg Transdermal Daily   [START ON 01/05/2023] predniSONE  50 mg Oral Q breakfast   thiamine  100 mg Oral Daily   Or  thiamine  100 mg Intravenous Daily    Continuous Infusions:  sodium chloride 100 mL/hr at 01/04/23 1610   famotidine (PEPCID) IV Stopped (01/04/23 1115)     LOS: 1 day     Alma Friendly, MD [facility]  If 7PM-7AM, please contact night-coverage [email or web address] 01/04/2023, 5:36 PM

## 2023-01-05 DIAGNOSIS — T783XXD Angioneurotic edema, subsequent encounter: Secondary | ICD-10-CM | POA: Diagnosis not present

## 2023-01-05 LAB — BASIC METABOLIC PANEL
Anion gap: 7 (ref 5–15)
BUN: 22 mg/dL (ref 8–23)
CO2: 25 mmol/L (ref 22–32)
Calcium: 8.2 mg/dL — ABNORMAL LOW (ref 8.9–10.3)
Chloride: 104 mmol/L (ref 98–111)
Creatinine, Ser: 1.3 mg/dL — ABNORMAL HIGH (ref 0.61–1.24)
GFR, Estimated: 59 mL/min — ABNORMAL LOW (ref >60)
Glucose, Bld: 122 mg/dL — ABNORMAL HIGH (ref 70–99)
Potassium: 3.2 mmol/L — ABNORMAL LOW (ref 3.5–5.1)
Sodium: 136 mmol/L (ref 135–145)

## 2023-01-05 MED ORDER — DIPHENHYDRAMINE HCL 25 MG PO CAPS: 25.0000 mg | ORAL_CAPSULE | Freq: Three times a day (TID) | ORAL | Status: DC | PRN

## 2023-01-05 MED ORDER — FAMOTIDINE 20 MG PO TABS
40.0000 mg | ORAL_TABLET | Freq: Every day | ORAL | Status: DC
  Administered 2023-01-05: 40 mg via ORAL
  Filled 2023-01-05: qty 2

## 2023-01-05 MED ORDER — AMLODIPINE BESYLATE 5 MG PO TABS: 5.0000 mg | ORAL_TABLET | Freq: Every day | ORAL | 1 refills | Status: AC

## 2023-01-05 NOTE — Hospital Course (Signed)
Raymond Nelson is a 72 y.o. male with medical history significant of hypertension, hyperlipidemia, COPD, tobacco use disorder, presents the ED with a chief complaint of tongue and neck swelling X 1 day. Patient reports he was in his normal state of health up until this started.Then he noticed difficulty with speech. He has been on lisinopril for years and has been tolerating it without any issues. Patient admitted for further management.     Today, pt reported feeling much better, but still reports some discomfort in his throat and with swallowing.

## 2023-01-05 NOTE — Discharge Summary (Signed)
Physician Discharge Summary   Patient: Raymond Nelson MRN: [id number] DOB: 12/16/1950  Admit date:     01/02/2023  Discharge date: 01/05/23  Discharge Physician: Deatra James   PCP: [facility]   Recommendations at discharge:    Follow with the PCP in 1 week Avoid lisinopril, all ACE inhibitors and ARB's due to recent allergies  Discharge Diagnoses: Principal Problem:   Angioedema Active Problems:   Hypokalemia   Essential hypertension   COPD (chronic obstructive pulmonary disease) (HCC)   Tobacco use disorder   Hyperlipidemia  Resolved Problems:   * No resolved hospital problems. *  Hospital Course: Raymond Nelson is a 72 y.o. male with medical history significant of hypertension, hyperlipidemia, COPD, tobacco use disorder, presents the ED with a chief complaint of tongue and neck swelling X 1 day. Patient reports he was in his normal state of health up until this started.Then he noticed difficulty with speech. He has been on lisinopril for years and has been tolerating it without any issues. Patient admitted for further management.     Today, pt reported feeling much better, but still reports some discomfort in his throat and with swallowing.   * Angioedema -Resolved -On room air, no difficulty with swallowing, or breathing  -Much improved left-sided tongue swelling and left neck swelling - CT scan shows severe edema of the epiglottis with left aryepiglottic fold and vallecular extending to left parapharyngeal and submandibular space indicative angioedema - POA: Moderate narrowing of lower pharyngeal airway - Patient was treated with dexamethasone, Benadryl 25 mg x 2, Pepcid in the ED - Was treated with Solu-Medrol, Benadryl scheduled, and Pepcid -discontinued 01/05/2023 - Patient is improving and will not likely require ventilator for airway protection   Hyperlipidemia - Continue statin  Tobacco use disorder - Smokes half a pack per day -  Casually trying to quit - Nicotine patch ordered  COPD (chronic obstructive pulmonary disease) (HCC) - Continue Breo and Incruse - No clinical evidence of exacerbation  Essential hypertension - Discontinue ACE indefinitely - Discontinued hydrochlorothiazide -Started on Amlodipine 5 mg p.o. daily, responded well  Hypokalemia - 50 mEq of potassium given in the ED - Initial potassium was 3.0 - Continue to monitor     Disposition: Home Diet recommendation:  Discharge Diet Orders (From admission, onward)     Start     Ordered   01/05/23 0000  Diet - low sodium heart healthy        01/05/23 1020           Cardiac diet DISCHARGE MEDICATION: Allergies as of 01/05/2023       Reactions   Lisinopril Swelling   angioedema        Medication List     STOP taking these medications    lisinopril-hydrochlorothiazide 20-25 MG tablet Commonly known as: ZESTORETIC       TAKE these medications    amLODipine 5 MG tablet Commonly known as: NORVASC Take 1 tablet (5 mg total) by mouth daily. Start taking on: January 06, 2023   aspirin EC 81 MG tablet Take 81 mg by mouth daily. Swallow whole.   atorvastatin 20 MG tablet Commonly known as: LIPITOR Take 20 mg by mouth daily.   guaiFENesin 600 MG 12 hr tablet Commonly known as: MUCINEX Take 600 mg by mouth 2 (two) times daily.   Trelegy Ellipta 200-62.5-25 MCG/ACT Aepb Generic drug: Fluticasone-Umeclidin-Vilant Take 1 puff by mouth daily.        Discharge Exam:  Filed Weights   01/02/23 1844 01/03/23 1236 01/05/23 0411  Weight: 93 kg 95.5 kg 99.1 kg        General:  AAO x 3,  cooperative, no distress;   HEENT:  Normocephalic, PERRL, otherwise with in Normal limits   Neuro:  CNII-XII intact. , normal motor and sensation, reflexes intact   Lungs:   Clear to auscultation BL, Respirations unlabored,  No wheezes / crackles  Cardio:    S1/S2, RRR, No murmure, No Rubs or Gallops   Abdomen:  Soft, non-tender,  bowel sounds active all four quadrants, no guarding or peritoneal signs.  Muscular  skeletal:  Limited exam -global generalized weaknesses - in bed, able to move all 4 extremities,   2+ pulses,  symmetric, No pitting edema  Skin:  Dry, warm to touch, negative for any Rashes,  Wounds: Please see nursing documentation          Condition at discharge: good  The results of significant diagnostics from this hospitalization (including imaging, microbiology, ancillary and laboratory) are listed below for reference.   Imaging Studies: CT Soft Tissue Neck W Contrast  Result Date: 01/02/2023 CLINICAL DATA:  Laryngeal edema EXAM: CT NECK WITH CONTRAST TECHNIQUE: Multidetector CT imaging of the neck was performed using the standard protocol following the bolus administration of intravenous contrast. RADIATION DOSE REDUCTION: This exam was performed according to the departmental dose-optimization program which includes automated exposure control, adjustment of the mA and/or kV according to patient size and/or use of iterative reconstruction technique. CONTRAST:  79m OMNIPAQUE IOHEXOL 300 MG/ML  SOLN COMPARISON:  None Available. FINDINGS: Pharynx and larynx: There is severe edema of the epiglottis, left aryepiglottic fold and vallecular, extending into the left parapharyngeal and submandibular space. No discrete fluid collection. There is moderate narrowing of the lower pharyngeal airway. Salivary glands: Inflammatory stranding adjacent to the left submandibular gland. The other salivary glands are normal. Thyroid: Normal. Lymph nodes: None enlarged or abnormal density. Vascular: Negative. Limited intracranial: Negative. Visualized orbits: Negative. Mastoids and visualized paranasal sinuses: Clear. Skeleton: No acute or aggressive process. Upper chest: Negative. Other: None. IMPRESSION: 1. Severe edema of the epiglottis, left aryepiglottic fold and vallecular, extending into the left parapharyngeal and  submandibular space. This may indicate angioedema, particularly given sudden onset and fact that the patient is on an ACE-inhibitor. Viral supraglottitis is a secondary consideration. 2. Moderate narrowing of the lower pharyngeal airway. These results were called by telephone at the time of interpretation on 01/02/2023 at 9:38 pm to provider SFredia Sorrow, who verbally acknowledged these results. Electronically Signed   By: KUlyses JarredM.D.   On: 01/02/2023 21:38    Microbiology: Results for orders placed or performed during the hospital encounter of 01/02/23  Resp panel by RT-PCR (RSV, Flu A&B, Covid) Anterior Nasal Swab     Status: None   Collection Time: 01/02/23 10:15 PM   Specimen: Anterior Nasal Swab  Result Value Ref Range Status   SARS Coronavirus 2 by RT PCR NEGATIVE NEGATIVE Final    Comment: (NOTE) SARS-CoV-2 target nucleic acids are NOT DETECTED.  The SARS-CoV-2 RNA is generally detectable in upper respiratory specimens during the acute phase of infection. The lowest concentration of SARS-CoV-2 viral copies this assay can detect is 138 copies/mL. A negative result does not preclude SARS-Cov-2 infection and should not be used as the sole basis for treatment or other patient management decisions. A negative result may occur with  improper specimen collection/handling, submission of specimen other than nasopharyngeal  swab, presence of viral mutation(s) within the areas targeted by this assay, and inadequate number of viral copies(<138 copies/mL). A negative result must be combined with clinical observations, patient history, and epidemiological information. The expected result is Negative.  Fact Sheet for Patients:  EntrepreneurPulse.com.au  Fact Sheet for Healthcare Providers:  IncredibleEmployment.be  This test is no t yet approved or cleared by the [place] FDA and  has been authorized for detection and/or diagnosis of SARS-CoV-2  by FDA under an Emergency Use Authorization (EUA). This EUA will remain  in effect (meaning this test can be used) for the duration of the COVID-19 declaration under Section 564(b)(1) of the Act, 21 U.S.C.section 360bbb-3(b)(1), unless the authorization is terminated  or revoked sooner.       Influenza A by PCR NEGATIVE NEGATIVE Final   Influenza B by PCR NEGATIVE NEGATIVE Final    Comment: (NOTE) The Xpert Xpress SARS-CoV-2/FLU/RSV plus assay is intended as an aid in the diagnosis of influenza from Nasopharyngeal swab specimens and should not be used as a sole basis for treatment. Nasal washings and aspirates are unacceptable for Xpert Xpress SARS-CoV-2/FLU/RSV testing.  Fact Sheet for Patients: EntrepreneurPulse.com.au  Fact Sheet for Healthcare Providers: IncredibleEmployment.be  This test is not yet approved or cleared by the [place] FDA and has been authorized for detection and/or diagnosis of SARS-CoV-2 by FDA under an Emergency Use Authorization (EUA). This EUA will remain in effect (meaning this test can be used) for the duration of the COVID-19 declaration under Section 564(b)(1) of the Act, 21 U.S.C. section 360bbb-3(b)(1), unless the authorization is terminated or revoked.     Resp Syncytial Virus by PCR NEGATIVE NEGATIVE Final    Comment: (NOTE) Fact Sheet for Patients: EntrepreneurPulse.com.au  Fact Sheet for Healthcare Providers: IncredibleEmployment.be  This test is not yet approved or cleared by the [place] FDA and has been authorized for detection and/or diagnosis of SARS-CoV-2 by FDA under an Emergency Use Authorization (EUA). This EUA will remain in effect (meaning this test can be used) for the duration of the COVID-19 declaration under Section 564(b)(1) of the Act, 21 U.S.C. section 360bbb-3(b)(1), unless the authorization is terminated or revoked.  Performed at  [facility], [address], [place], [place] [address]   Group A Strep by PCR     Status: None   Collection Time: 01/02/23 10:15 PM   Specimen: Anterior Nasal Swab; Sterile Swab  Result Value Ref Range Status   Group A Strep by PCR NOT DETECTED NOT DETECTED Final    Comment: Performed at [facility], [address] [place], [place] [address]  MRSA Next Gen by PCR, Nasal     Status: None   Collection Time: 01/03/23 10:12 AM   Specimen: Nasal Mucosa; Nasal Swab  Result Value Ref Range Status   MRSA by PCR Next Gen NOT DETECTED NOT DETECTED Final    Comment: (NOTE) The GeneXpert MRSA Assay (FDA approved for NASAL specimens only), is one component of a comprehensive MRSA colonization surveillance program. It is not intended to diagnose MRSA infection nor to guide or monitor treatment for MRSA infections. Test performance is not FDA approved in patients less than 23 years old. Performed at [facility], [address], [place],  [address]     Labs: CBC: Recent Labs  Lab 01/02/23 1926 01/03/23 0438  WBC 8.3 6.1  NEUTROABS 4.7 5.4  HGB 16.9 15.2  HCT 49.7 44.0  MCV 95.9 95.0  PLT 202 242   Basic Metabolic  Panel: Recent Labs  Lab 01/02/23 1926 01/03/23 0438 01/04/23 0442 01/04/23 1508 01/05/23 0338  NA 136 131* 128* 135 136  K 3.0* 4.2 3.5 3.5 3.2*  CL 95* 98 99 101 104  CO2 27 21* '25 25 25  '$ GLUCOSE 84 169* 166* 175* 122*  BUN '13 15 22 '$ 24* 22  CREATININE 1.12 1.14 1.30* 1.53* 1.30*  CALCIUM 9.8 8.7* 8.4* 8.6* 8.2*  MG  --  1.5* 2.0  --   --    Liver Function Tests: Recent Labs  Lab 01/02/23 1926 01/03/23 0438  AST 31 28  ALT 28 25  ALKPHOS 60 51  BILITOT 0.7 0.8  PROT 7.2 6.2*  ALBUMIN 4.1 3.4*   CBG: No results for input(s): "GLUCAP" in the last 168 hours.  Discharge time spent: greater than 30 minutes.  Signed: Deatra James, MD [facility] 01/05/2023

## 2023-01-05 NOTE — Progress Notes (Signed)
Discharged to home. IV access removed without problems. Daughter help patient get dressed and patient was given and taught about changes in medications. Patient was taken by wheel chair to car for transport home.

## 2023-01-17 DIAGNOSIS — H353133 Nonexudative age-related macular degeneration, bilateral, advanced atrophic without subfoveal involvement: Secondary | ICD-10-CM | POA: Diagnosis not present

## 2023-01-17 DIAGNOSIS — H524 Presbyopia: Secondary | ICD-10-CM | POA: Diagnosis not present

## 2023-02-04 DIAGNOSIS — Z6833 Body mass index (BMI) 33.0-33.9, adult: Secondary | ICD-10-CM | POA: Diagnosis not present

## 2023-02-05 DIAGNOSIS — E6609 Other obesity due to excess calories: Secondary | ICD-10-CM | POA: Diagnosis not present

## 2023-02-05 DIAGNOSIS — I10 Essential (primary) hypertension: Secondary | ICD-10-CM | POA: Diagnosis not present

## 2023-02-05 DIAGNOSIS — E7849 Other hyperlipidemia: Secondary | ICD-10-CM | POA: Diagnosis not present

## 2023-02-05 DIAGNOSIS — G4733 Obstructive sleep apnea (adult) (pediatric): Secondary | ICD-10-CM | POA: Diagnosis not present

## 2023-02-05 DIAGNOSIS — N1831 Chronic kidney disease, stage 3a: Secondary | ICD-10-CM | POA: Diagnosis not present

## 2023-02-05 DIAGNOSIS — J449 Chronic obstructive pulmonary disease, unspecified: Secondary | ICD-10-CM | POA: Diagnosis not present

## 2023-02-05 DIAGNOSIS — R69 Illness, unspecified: Secondary | ICD-10-CM | POA: Diagnosis not present

## 2023-02-05 DIAGNOSIS — Z6833 Body mass index (BMI) 33.0-33.9, adult: Secondary | ICD-10-CM | POA: Diagnosis not present

## 2023-02-05 DIAGNOSIS — I700 Atherosclerosis of aorta: Secondary | ICD-10-CM | POA: Diagnosis not present

## 2023-02-11 DIAGNOSIS — H25811 Combined forms of age-related cataract, right eye: Secondary | ICD-10-CM | POA: Diagnosis not present

## 2023-02-11 DIAGNOSIS — H25812 Combined forms of age-related cataract, left eye: Secondary | ICD-10-CM | POA: Diagnosis not present

## 2023-02-21 DIAGNOSIS — H25812 Combined forms of age-related cataract, left eye: Secondary | ICD-10-CM | POA: Diagnosis not present

## 2023-02-21 DIAGNOSIS — H269 Unspecified cataract: Secondary | ICD-10-CM | POA: Diagnosis not present

## 2023-05-08 DIAGNOSIS — R030 Elevated blood-pressure reading, without diagnosis of hypertension: Secondary | ICD-10-CM | POA: Diagnosis not present

## 2023-05-08 DIAGNOSIS — Z6830 Body mass index (BMI) 30.0-30.9, adult: Secondary | ICD-10-CM | POA: Diagnosis not present

## 2023-05-08 DIAGNOSIS — F17210 Nicotine dependence, cigarettes, uncomplicated: Secondary | ICD-10-CM | POA: Diagnosis not present

## 2023-05-08 DIAGNOSIS — R634 Abnormal weight loss: Secondary | ICD-10-CM | POA: Diagnosis not present

## 2023-05-08 DIAGNOSIS — K5900 Constipation, unspecified: Secondary | ICD-10-CM | POA: Diagnosis not present

## 2023-05-09 DIAGNOSIS — D751 Secondary polycythemia: Secondary | ICD-10-CM | POA: Diagnosis not present

## 2023-05-09 DIAGNOSIS — K5900 Constipation, unspecified: Secondary | ICD-10-CM | POA: Diagnosis not present

## 2023-05-09 DIAGNOSIS — R634 Abnormal weight loss: Secondary | ICD-10-CM | POA: Diagnosis not present

## 2023-05-09 DIAGNOSIS — F17210 Nicotine dependence, cigarettes, uncomplicated: Secondary | ICD-10-CM | POA: Diagnosis not present

## 2023-05-12 DIAGNOSIS — D751 Secondary polycythemia: Secondary | ICD-10-CM | POA: Insufficient documentation

## 2023-05-12 NOTE — Progress Notes (Signed)
[facility] [address], [place] [address]   Clinic Day:  05/12/2023  Referring physician: Richardean Chimera, MD  Patient Care Team: Richardean Chimera, MD as PCP - General (Family Medicine)   ASSESSMENT & PLAN:   Assessment: ***  Plan: ***  No orders of the defined types were placed in this encounter.     I,Katie Daubenspeck,acting as a Neurosurgeon for Doreatha Massed, MD.,have documented all relevant documentation on the behalf of Doreatha Massed, MD,as directed by  Doreatha Massed, MD while in the presence of Doreatha Massed, MD.   ***  Katie Daubenspeck   6/9/202411:02 PM  CHIEF COMPLAINT/PURPOSE OF CONSULT:   Diagnosis: erythrocytosis  Current Therapy:  ***  HISTORY OF PRESENT ILLNESS:   Raymond Nelson is a 72 y.o. male presenting to clinic today for evaluation of erythrocytosis at the request of Dr. Reuel Boom.  He was found to have abnormal CBC from 05/08/23, with evidence of erythrocytosis-- RBC 6.21, hemoglobin 19.9, HCT 55.4%. Of note, previous CBC obtained on 01/03/23 during hospitalization for angioedema was WNL-- RBC 4.63, Hgb 15.2, HCT 44%.  Today, he states that he is doing well overall. His appetite level is at ***%. His energy level is at ***%.  PAST MEDICAL HISTORY:   Past Medical History: Past Medical History:  Diagnosis Date   Hypertension     Surgical History: No past surgical history on file.  Social History: Social History   Socioeconomic History   Marital status: Married    Spouse name: Not on file   Number of children: Not on file   Years of education: Not on file   Highest education level: Not on file  Occupational History   Not on file  Tobacco Use   Smoking status: Every Day    Packs/day: .5    Types: Cigarettes   Smokeless tobacco: Never  Vaping Use   Vaping Use: Never used  Substance and Sexual Activity   Alcohol use: Yes    Comment: couple drinks daily, 3-4 airpplane bottles, or half pint liquor  daily.   Drug use: Never   Sexual activity: Not on file  Other Topics Concern   Not on file  Social History Narrative   Not on file   Social Determinants of Health   Financial Resource Strain: Not on file  Food Insecurity: No Food Insecurity (01/03/2023)   Hunger Vital Sign    Worried About Running Out of Food in the Last Year: Never true    Ran Out of Food in the Last Year: Never true  Transportation Needs: No Transportation Needs (01/03/2023)   PRAPARE - Administrator, Civil Service (Medical): No    Lack of Transportation (Non-Medical): No  Physical Activity: Not on file  Stress: Not on file  Social Connections: Not on file  Intimate Partner Violence: Not At Risk (01/03/2023)   Humiliation, Afraid, Rape, and Kick questionnaire    Fear of Current or Ex-Partner: No    Emotionally Abused: No    Physically Abused: No    Sexually Abused: No    Family History: No family history on file.  Current Medications:  Current Outpatient Medications:    amLODipine (NORVASC) 5 MG tablet, Take 1 tablet (5 mg total) by mouth daily., Disp: 30 tablet, Rfl: 1   aspirin EC 81 MG tablet, Take 81 mg by mouth daily. Swallow whole., Disp: , Rfl:    atorvastatin (LIPITOR) 20 MG tablet, Take 20 mg by mouth daily., Disp: ,  Rfl:    guaiFENesin (MUCINEX) 600 MG 12 hr tablet, Take 600 mg by mouth 2 (two) times daily., Disp: , Rfl:    TRELEGY ELLIPTA 200-62.5-25 MCG/ACT AEPB, Take 1 puff by mouth daily., Disp: , Rfl:    Allergies: Allergies  Allergen Reactions   Lisinopril Swelling    angioedema    REVIEW OF SYSTEMS:   Review of Systems  Constitutional:  Negative for chills, fatigue and fever.  HENT:   Negative for lump/mass, mouth sores, nosebleeds, sore throat and trouble swallowing.   Eyes:  Negative for eye problems.  Respiratory:  Negative for cough and shortness of breath.   Cardiovascular:  Negative for chest pain, leg swelling and palpitations.  Gastrointestinal:  Negative for  abdominal pain, constipation, diarrhea, nausea and vomiting.  Genitourinary:  Negative for bladder incontinence, difficulty urinating, dysuria, frequency, hematuria and nocturia.   Musculoskeletal:  Negative for arthralgias, back pain, flank pain, myalgias and neck pain.  Skin:  Negative for itching and rash.  Neurological:  Negative for dizziness, headaches and numbness.  Hematological:  Does not bruise/bleed easily.  Psychiatric/Behavioral:  Negative for depression, sleep disturbance and suicidal ideas. The patient is not nervous/anxious.   All other systems reviewed and are negative.    VITALS:   There were no vitals taken for this visit.  Wt Readings from Last 3 Encounters:  01/05/23 218 lb 7.6 oz (99.1 kg)  03/17/20 190 lb (86.2 kg)    There is no height or weight on file to calculate BMI.   PHYSICAL EXAM:   Physical Exam Vitals and nursing note reviewed. Exam conducted with a chaperone present.  Constitutional:      Appearance: Normal appearance.  Cardiovascular:     Rate and Rhythm: Normal rate and regular rhythm.     Pulses: Normal pulses.     Heart sounds: Normal heart sounds.  Pulmonary:     Effort: Pulmonary effort is normal.     Breath sounds: Normal breath sounds.  Abdominal:     Palpations: Abdomen is soft. There is no hepatomegaly, splenomegaly or mass.     Tenderness: There is no abdominal tenderness.  Musculoskeletal:     Right lower leg: No edema.     Left lower leg: No edema.  Lymphadenopathy:     Cervical: No cervical adenopathy.     Right cervical: No superficial, deep or posterior cervical adenopathy.    Left cervical: No superficial, deep or posterior cervical adenopathy.     Upper Body:     Right upper body: No supraclavicular or axillary adenopathy.     Left upper body: No supraclavicular or axillary adenopathy.  Neurological:     General: No focal deficit present.     Mental Status: He is alert and oriented to person, place, and time.   Psychiatric:        Mood and Affect: Mood normal.        Behavior: Behavior normal.     LABS:      Latest Ref Rng & Units 01/03/2023    4:38 AM 01/02/2023    7:26 PM 03/17/2020    8:48 AM  CBC  WBC 4.0 - 10.5 K/uL 6.1  8.3  8.1   Hemoglobin 13.0 - 17.0 g/dL 29.5  62.1  30.8   Hematocrit 39.0 - 52.0 % 44.0  49.7  46.6   Platelets 150 - 400 K/uL 161  202  224       Latest Ref Rng & Units 01/05/2023  3:38 AM 01/04/2023    3:08 PM 01/04/2023    4:42 AM  CMP  Glucose 70 - 99 mg/dL 161  096  045   BUN 8 - 23 mg/dL 22  24  22    Creatinine 0.61 - 1.24 mg/dL 4.09  8.11  9.14   Sodium 135 - 145 mmol/L 136  135  128   Potassium 3.5 - 5.1 mmol/L 3.2  3.5  3.5   Chloride 98 - 111 mmol/L 104  101  99   CO2 22 - 32 mmol/L 25  25  25    Calcium 8.9 - 10.3 mg/dL 8.2  8.6  8.4      No results found for: "CEA1", "CEA" / No results found for: "CEA1", "CEA" No results found for: "PSA1" No results found for: "NWG956" No results found for: "CAN125"  No results found for: "TOTALPROTELP", "ALBUMINELP", "A1GS", "A2GS", "BETS", "BETA2SER", "GAMS", "MSPIKE", "SPEI" No results found for: "TIBC", "FERRITIN", "IRONPCTSAT" No results found for: "LDH"   STUDIES:   No results found.

## 2023-05-13 ENCOUNTER — Inpatient Hospital Stay: Payer: Medicare HMO

## 2023-05-13 ENCOUNTER — Inpatient Hospital Stay: Payer: Medicare HMO | Attending: Hematology | Admitting: Hematology

## 2023-05-13 VITALS — BP 139/89 | HR 91 | Temp 97.9°F | Resp 18 | Ht 68.0 in | Wt 201.0 lb

## 2023-05-13 DIAGNOSIS — Z122 Encounter for screening for malignant neoplasm of respiratory organs: Secondary | ICD-10-CM

## 2023-05-13 DIAGNOSIS — F17210 Nicotine dependence, cigarettes, uncomplicated: Secondary | ICD-10-CM | POA: Diagnosis not present

## 2023-05-13 DIAGNOSIS — D751 Secondary polycythemia: Secondary | ICD-10-CM

## 2023-05-13 DIAGNOSIS — D582 Other hemoglobinopathies: Secondary | ICD-10-CM

## 2023-05-13 LAB — CBC WITH DIFFERENTIAL/PLATELET
Abs Immature Granulocytes: 0.02 10*3/uL (ref 0.00–0.07)
Basophils Absolute: 0 10*3/uL (ref 0.0–0.1)
Basophils Relative: 1 %
Eosinophils Absolute: 0.2 10*3/uL (ref 0.0–0.5)
Eosinophils Relative: 2 %
HCT: 55.2 % — ABNORMAL HIGH (ref 39.0–52.0)
Hemoglobin: 19.1 g/dL — ABNORMAL HIGH (ref 13.0–17.0)
Immature Granulocytes: 0 %
Lymphocytes Relative: 19 %
Lymphs Abs: 1.7 10*3/uL (ref 0.7–4.0)
MCH: 32.3 pg (ref 26.0–34.0)
MCHC: 34.6 g/dL (ref 30.0–36.0)
MCV: 93.4 fL (ref 80.0–100.0)
Monocytes Absolute: 1.2 10*3/uL — ABNORMAL HIGH (ref 0.1–1.0)
Monocytes Relative: 14 %
Neutro Abs: 5.6 10*3/uL (ref 1.7–7.7)
Neutrophils Relative %: 64 %
Platelets: 209 10*3/uL (ref 150–400)
RBC: 5.91 MIL/uL — ABNORMAL HIGH (ref 4.22–5.81)
RDW: 12.7 % (ref 11.5–15.5)
WBC: 8.7 10*3/uL (ref 4.0–10.5)
nRBC: 0 % (ref 0.0–0.2)

## 2023-05-13 NOTE — Patient Instructions (Signed)
You were seen and examined today by Dr. Ellin Saba. Dr. Ellin Saba is a hematologist, meaning that he specializes in blood abnormalities. Dr. Ellin Saba discussed your past medical history, family history of cancers/blood conditions and the events that led to you being here today.  You were referred to Dr. Ellin Saba due to elevated hemoglobin.  Dr. Ellin Saba has recommended additional labs today for further evaluation.  Follow-up as scheduled.

## 2023-05-14 ENCOUNTER — Encounter: Payer: Self-pay | Admitting: *Deleted

## 2023-05-14 LAB — ERYTHROPOIETIN: Erythropoietin: 8.1 m[IU]/mL (ref 2.6–18.5)

## 2023-05-28 LAB — JAK2 V617F RFX CALR/MPL/E12-15

## 2023-05-28 LAB — CALR +MPL + E12-E15  (REFLEX)

## 2023-06-14 ENCOUNTER — Ambulatory Visit (HOSPITAL_COMMUNITY)
Admission: RE | Admit: 2023-06-14 | Discharge: 2023-06-14 | Disposition: A | Discharge status: Home / Self Care | Payer: Medicare HMO | Source: Ambulatory Visit | Attending: Hematology | Admitting: Hematology

## 2023-06-14 DIAGNOSIS — D582 Other hemoglobinopathies: Secondary | ICD-10-CM | POA: Diagnosis not present

## 2023-06-14 DIAGNOSIS — J432 Centrilobular emphysema: Secondary | ICD-10-CM | POA: Insufficient documentation

## 2023-06-14 DIAGNOSIS — I700 Atherosclerosis of aorta: Secondary | ICD-10-CM | POA: Diagnosis not present

## 2023-06-14 DIAGNOSIS — I2510 Atherosclerotic heart disease of native coronary artery without angina pectoris: Secondary | ICD-10-CM | POA: Insufficient documentation

## 2023-06-14 DIAGNOSIS — Z122 Encounter for screening for malignant neoplasm of respiratory organs: Secondary | ICD-10-CM | POA: Diagnosis not present

## 2023-06-14 DIAGNOSIS — F17210 Nicotine dependence, cigarettes, uncomplicated: Secondary | ICD-10-CM | POA: Diagnosis not present

## 2023-06-18 ENCOUNTER — Inpatient Hospital Stay: Payer: Medicare HMO | Attending: Hematology | Admitting: Oncology

## 2023-06-18 VITALS — BP 152/87 | HR 88 | Temp 97.3°F | Resp 18 | Wt 198.9 lb

## 2023-06-18 DIAGNOSIS — F17210 Nicotine dependence, cigarettes, uncomplicated: Secondary | ICD-10-CM | POA: Insufficient documentation

## 2023-06-18 DIAGNOSIS — D582 Other hemoglobinopathies: Secondary | ICD-10-CM | POA: Diagnosis not present

## 2023-06-18 DIAGNOSIS — D751 Secondary polycythemia: Secondary | ICD-10-CM | POA: Diagnosis not present

## 2023-06-18 DIAGNOSIS — G4730 Sleep apnea, unspecified: Secondary | ICD-10-CM | POA: Diagnosis not present

## 2023-06-18 DIAGNOSIS — Z122 Encounter for screening for malignant neoplasm of respiratory organs: Secondary | ICD-10-CM | POA: Diagnosis not present

## 2023-06-18 NOTE — Progress Notes (Signed)
[facility] [address] [place] [address]   Clinic Day:  06/18/2023  Referring physician: Richardean Chimera, MD  Patient Care Team: Richardean Chimera, MD as PCP - General (Family Medicine)   ASSESSMENT & PLAN:   Assessment:  1.  Erythrocytosis: - Patient seen at the request of Dr. Reuel Boom. - Recent CBC on 05/08/2023 with hemoglobin 19.9, elevated hematocrit and RBC count. - He denies being on testosterone supplement/diuretics. - No aquagenic pruritus/vasomotor symptoms/thrombosis. - Recent decreased eating due to dental problems.  Started back eating well.  2.  Social/family history: - Lives with daughter.  Independent of ADLs and IADLs. 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  Current active smoker, 1 pack every 1 and half day started at age 61.  Drinks 4 beers per day in the evening. - No family history of polycythemia vera.  No malignancies.  Plan:  1.  Erythrocytosis: - Labs from 05/12/2020 negative for JAK2 mutation with reflex. -Repeat CBC showed hemoglobin 19 (15), hematocrit 55.2 with a normal differential.  Erythropoietin level are normal at 8.1. -Discussed erythrocytosis likely secondary to smoking and possibly some underlying sleep apnea. -Patient would like to defer sleep study at this time. -Discussed briefly phlebotomy or blood donation.  He would like to repeat his labs and decide at that time.  2.  Smoking history: - CT low-dose lung screening from 06/14/2023 revealed Lung RADS 1 negative.  Recommend low-dose CT scan every 12 months.    PLAN SUMMARY: >> Repeat labs in 8-12 weeks and follow-up 2 days later. >> Low-dose CT scan in July 2025.     Orders Placed This Encounter  Procedures   CT CHEST LUNG CA SCREEN LOW DOSE W/O CM    Standing Status:   Future    Standing Expiration Date:   06/17/2024    Order Specific Question:   Preferred Imaging Location?    Answer:   [facility]   Ambulatory  referral to Sleep Studies    Referral Priority:   Routine    Referral Type:   Consultation    Referral Reason:   Specialty Services Required    Number of Visits Requested:   1   I spent 20 minutes dedicated to the care of this patient (face-to-face and non-face-to-face) on the date of the encounter to include what is described in the assessment and plan.   Raymond Kaufmann, NP   7/16/202412:38 PM  CHIEF COMPLAINT/PURPOSE OF CONSULT:   Diagnosis: erythrocytosis  Current Therapy: Under workup  HISTORY OF PRESENT ILLNESS:   Raymond Nelson is a 72 y.o. male presenting to clinic today for evaluation of erythrocytosis at the request of Dr. Reuel Boom.  He was found to have abnormal CBC from 05/08/23, with evidence of erythrocytosis-- RBC 6.21, hemoglobin 19.9, HCT 55.4%. Of note, previous CBC obtained on 01/03/23 during hospitalization for angioedema was WNL-- RBC 4.63, Hgb 15.2, HCT 44%.  Today, he states that he is doing well overall. His appetite level is at 100%. His energy level is at 75%.  Reports he continues to smoke but has decreased the amount of cigarettes from over a pack to 4-5 each day.  Reports history of sleep apnea with the use of a CPAP machine greater than 8 years ago.  States he used his CPAP machine at bedtime for 2 years and then stopped using.  Has not been evaluated since.  States he feels fine and does not want to repeat  a sleep study if he does not have to.  PAST MEDICAL HISTORY:   Past Medical History: Past Medical History:  Diagnosis Date   Hypertension     Surgical History: No past surgical history on file.  Social History: Social History   Socioeconomic History   Marital status: Married    Spouse name: Not on file   Number of children: Not on file   Years of education: Not on file   Highest education level: Not on file  Occupational History   Not on file  Tobacco Use   Smoking status: Every Day    Current packs/day: 0.50    Types: Cigarettes   Smokeless  tobacco: Never  Vaping Use   Vaping status: Never Used  Substance and Sexual Activity   Alcohol use: Yes    Comment: couple drinks daily, 3-4 airpplane bottles, or half pint liquor daily.   Drug use: Never   Sexual activity: Not on file  Other Topics Concern   Not on file  Social History Narrative   Not on file   Social Determinants of Health   Financial Resource Strain: Not on file  Food Insecurity: No Food Insecurity (01/03/2023)   Hunger Vital Sign    Worried About Running Out of Food in the Last Year: Never true    Ran Out of Food in the Last Year: Never true  Transportation Needs: No Transportation Needs (01/03/2023)   PRAPARE - Administrator, Civil Service (Medical): No    Lack of Transportation (Non-Medical): No  Physical Activity: Not on file  Stress: Not on file  Social Connections: Not on file  Intimate Partner Violence: Not At Risk (01/03/2023)   Humiliation, Afraid, Rape, and Kick questionnaire    Fear of Current or Ex-Partner: No    Emotionally Abused: No    Physically Abused: No    Sexually Abused: No    Family History: No family history on file.  Current Medications:  Current Outpatient Medications:    aspirin EC 81 MG tablet, Take 81 mg by mouth daily. Swallow whole., Disp: , Rfl:    atorvastatin (LIPITOR) 20 MG tablet, Take 20 mg by mouth daily., Disp: , Rfl:    guaiFENesin (MUCINEX) 600 MG 12 hr tablet, Take 600 mg by mouth 2 (two) times daily., Disp: , Rfl:    TRELEGY ELLIPTA 200-62.5-25 MCG/ACT AEPB, Take 1 puff by mouth daily., Disp: , Rfl:    amLODipine (NORVASC) 5 MG tablet, Take 1 tablet (5 mg total) by mouth daily., Disp: 30 tablet, Rfl: 1   Allergies: Allergies  Allergen Reactions   Lisinopril Swelling    angioedema    REVIEW OF SYSTEMS:   Review of Systems  Constitutional: Negative.  Negative for appetite change, chills, fatigue and fever.  HENT:  Negative.  Negative for hearing loss, lump/mass, mouth sores and nosebleeds.    Eyes: Negative.  Negative for eye problems.  Respiratory:  Negative for cough, hemoptysis and shortness of breath.   Cardiovascular: Negative.  Negative for chest pain and leg swelling.  Gastrointestinal: Negative.  Negative for abdominal pain, blood in stool, constipation, diarrhea, nausea and vomiting.  Endocrine: Negative.  Negative for hot flashes.  Genitourinary: Negative.  Negative for bladder incontinence, difficulty urinating, dysuria, frequency and hematuria.   Musculoskeletal: Negative.  Negative for back pain, flank pain, gait problem and myalgias.  Skin: Negative.  Negative for itching and rash.  Neurological: Negative.  Negative for dizziness, gait problem, headaches, light-headedness and numbness.  Hematological: Negative.  Negative for adenopathy.  Psychiatric/Behavioral:  Negative for confusion. The patient is not nervous/anxious.      VITALS:   Blood pressure (!) 152/87, pulse 88, temperature (!) 97.3 F (36.3 C), temperature source Tympanic, resp. rate 18, weight 198 lb 13.7 oz (90.2 kg), SpO2 98%.  Wt Readings from Last 3 Encounters:  06/18/23 198 lb 13.7 oz (90.2 kg)  05/13/23 201 lb (91.2 kg)  01/05/23 218 lb 7.6 oz (99.1 kg)    Body mass index is 30.24 kg/m.   PHYSICAL EXAM:   Physical Exam Constitutional:      Appearance: Normal appearance.  HENT:     Head: Normocephalic and atraumatic.  Eyes:     Pupils: Pupils are equal, round, and reactive to light.  Cardiovascular:     Rate and Rhythm: Normal rate and regular rhythm.     Heart sounds: Normal heart sounds. No murmur heard. Pulmonary:     Effort: Pulmonary effort is normal.     Breath sounds: Normal breath sounds. No wheezing.  Abdominal:     General: Bowel sounds are normal. There is no distension.     Palpations: Abdomen is soft.     Tenderness: There is no abdominal tenderness.  Musculoskeletal:        General: Normal range of motion.     Cervical back: Normal range of motion.  Skin:     General: Skin is warm and dry.     Findings: No rash.  Neurological:     Mental Status: He is alert and oriented to person, place, and time.  Psychiatric:        Judgment: Judgment normal.     LABS:      Latest Ref Rng & Units 05/13/2023   11:09 AM 01/03/2023    4:38 AM 01/02/2023    7:26 PM  CBC  WBC 4.0 - 10.5 K/uL 8.7  6.1  8.3   Hemoglobin 13.0 - 17.0 g/dL 82.9  56.2  13.0   Hematocrit 39.0 - 52.0 % 55.2  44.0  49.7   Platelets 150 - 400 K/uL 209  161  202       Latest Ref Rng & Units 01/05/2023    3:38 AM 01/04/2023    3:08 PM 01/04/2023    4:42 AM  CMP  Glucose 70 - 99 mg/dL 865  784  696   BUN 8 - 23 mg/dL 22  24  22    Creatinine 0.61 - 1.24 mg/dL 2.95  2.84  1.32   Sodium 135 - 145 mmol/L 136  135  128   Potassium 3.5 - 5.1 mmol/L 3.2  3.5  3.5   Chloride 98 - 111 mmol/L 104  101  99   CO2 22 - 32 mmol/L 25  25  25    Calcium 8.9 - 10.3 mg/dL 8.2  8.6  8.4      No results found for: "CEA1", "CEA" / No results found for: "CEA1", "CEA" No results found for: "PSA1" No results found for: "GMW102" No results found for: "CAN125"  No results found for: "TOTALPROTELP", "ALBUMINELP", "A1GS", "A2GS", "BETS", "BETA2SER", "GAMS", "MSPIKE", "SPEI" No results found for: "TIBC", "FERRITIN", "IRONPCTSAT" No results found for: "LDH"   STUDIES:   CT CHEST LUNG CA SCREEN LOW DOSE W/O CM  Result Date: 06/15/2023 CLINICAL DATA:  72 year old male current smoker with 40 pack-year history of smoking. Lung cancer screening examination. EXAM: CT CHEST WITHOUT CONTRAST LOW-DOSE FOR LUNG CANCER SCREENING TECHNIQUE: Multidetector CT imaging  of the chest was performed following the standard protocol without IV contrast. RADIATION DOSE REDUCTION: This exam was performed according to the departmental dose-optimization program which includes automated exposure control, adjustment of the mA and/or kV according to patient size and/or use of iterative reconstruction technique. COMPARISON:  Low-dose lung  cancer screening chest CT 09/19/2021. FINDINGS: Cardiovascular: Heart size is normal. There is no significant pericardial fluid, thickening or pericardial calcification. There is aortic atherosclerosis, as well as atherosclerosis of the great vessels of the mediastinum and the coronary arteries, including calcified atherosclerotic plaque in the left main, left anterior descending, left circumflex and right coronary arteries. Calcifications of the aortic valve. Mediastinum/Nodes: No pathologically enlarged mediastinal or hilar lymph nodes. Please note that accurate exclusion of hilar adenopathy is limited on noncontrast CT scans. Esophagus is unremarkable in appearance. No axillary lymphadenopathy. Lungs/Pleura: Small calcified granulomas are noted in the right lower lobe. No other suspicious appearing pulmonary nodules or masses are noted. No acute consolidative airspace disease. No pleural effusions. Mild diffuse bronchial wall thickening with very mild centrilobular and paraseptal emphysema. Upper Abdomen: Aortic atherosclerosis. Diffuse low attenuation throughout the visualized hepatic parenchyma, indicative of a background of hepatic steatosis. Musculoskeletal: Well-defined ovoid shaped 4.0 x 2.3 cm low-intermediate attenuation (22 HU) lesion in the subcutaneous fat of the left back (axial image 47 of series 2), similar to the prior study, likely a large sebaceous cyst. There are no aggressive appearing lytic or blastic lesions noted in the visualized portions of the skeleton. IMPRESSION: 1. Lung-RADS 1S, negative. Continue annual screening with low-dose chest CT without contrast in 12 months. 2. The "S" modifier above refers to potentially clinically significant non lung cancer related findings. Specifically, there is aortic atherosclerosis, in addition to left main and three-vessel coronary artery disease. Please note that although the presence of coronary artery calcium documents the presence of coronary  artery disease, the severity of this disease and any potential stenosis cannot be assessed on this non-gated CT examination. Assessment for potential risk factor modification, dietary therapy or pharmacologic therapy may be warranted, if clinically indicated. 3. There are calcifications of the aortic valve. Echocardiographic correlation for evaluation of potential valvular dysfunction may be warranted if clinically indicated. 4. Mild diffuse bronchial wall thickening with very mild centrilobular and paraseptal emphysema; imaging findings suggestive of underlying COPD. Aortic Atherosclerosis (ICD10-I70.0) and Emphysema (ICD10-J43.9). Electronically Signed   By: Trudie Reed M.D.   On: 06/15/2023 11:41

## 2023-06-19 NOTE — Progress Notes (Signed)
Patient notified of LDCT Lung Cancer Screening Results via mail with the recommendation to follow-up in 12 months. Patient's referring provider has been sent a copy of results. Results are as follows:  IMPRESSION: 1. Lung-RADS 1S, negative. Continue annual screening with low-dose chest CT without contrast in 12 months. 2. The "S" modifier above refers to potentially clinically significant non lung cancer related findings. Specifically, there is aortic atherosclerosis, in addition to left main and three-vessel coronary artery disease. Please note that although the presence of coronary artery calcium documents the presence of coronary artery disease, the severity of this disease and any potential stenosis cannot be assessed on this non-gated CT examination. Assessment for potential risk factor modification, dietary therapy or pharmacologic therapy may be warranted, if clinically indicated. 3. There are calcifications of the aortic valve. Echocardiographic correlation for evaluation of potential valvular dysfunction may be warranted if clinically indicated. 4. Mild diffuse bronchial wall thickening with very mild centrilobular and paraseptal emphysema; imaging findings suggestive of underlying COPD.

## 2023-06-27 DIAGNOSIS — F17210 Nicotine dependence, cigarettes, uncomplicated: Secondary | ICD-10-CM | POA: Diagnosis not present

## 2023-06-27 DIAGNOSIS — R030 Elevated blood-pressure reading, without diagnosis of hypertension: Secondary | ICD-10-CM | POA: Diagnosis not present

## 2023-06-27 DIAGNOSIS — Z6831 Body mass index (BMI) 31.0-31.9, adult: Secondary | ICD-10-CM | POA: Diagnosis not present

## 2023-06-27 DIAGNOSIS — L03312 Cellulitis of back [any part except buttock]: Secondary | ICD-10-CM | POA: Diagnosis not present

## 2023-07-04 DIAGNOSIS — L723 Sebaceous cyst: Secondary | ICD-10-CM | POA: Diagnosis not present

## 2023-07-04 DIAGNOSIS — L089 Local infection of the skin and subcutaneous tissue, unspecified: Secondary | ICD-10-CM | POA: Diagnosis not present

## 2023-07-05 DIAGNOSIS — J432 Centrilobular emphysema: Secondary | ICD-10-CM | POA: Diagnosis not present

## 2023-07-05 DIAGNOSIS — Z79899 Other long term (current) drug therapy: Secondary | ICD-10-CM | POA: Diagnosis not present

## 2023-07-05 DIAGNOSIS — I517 Cardiomegaly: Secondary | ICD-10-CM | POA: Diagnosis not present

## 2023-07-05 DIAGNOSIS — F17210 Nicotine dependence, cigarettes, uncomplicated: Secondary | ICD-10-CM | POA: Diagnosis not present

## 2023-07-05 DIAGNOSIS — L723 Sebaceous cyst: Secondary | ICD-10-CM | POA: Diagnosis not present

## 2023-07-05 DIAGNOSIS — Z8673 Personal history of transient ischemic attack (TIA), and cerebral infarction without residual deficits: Secondary | ICD-10-CM | POA: Diagnosis not present

## 2023-07-05 DIAGNOSIS — G4730 Sleep apnea, unspecified: Secondary | ICD-10-CM | POA: Diagnosis not present

## 2023-07-05 DIAGNOSIS — E785 Hyperlipidemia, unspecified: Secondary | ICD-10-CM | POA: Diagnosis not present

## 2023-07-05 DIAGNOSIS — I700 Atherosclerosis of aorta: Secondary | ICD-10-CM | POA: Diagnosis not present

## 2023-07-05 DIAGNOSIS — I358 Other nonrheumatic aortic valve disorders: Secondary | ICD-10-CM | POA: Diagnosis not present

## 2023-07-05 DIAGNOSIS — Z7982 Long term (current) use of aspirin: Secondary | ICD-10-CM | POA: Diagnosis not present

## 2023-07-05 DIAGNOSIS — I10 Essential (primary) hypertension: Secondary | ICD-10-CM | POA: Diagnosis not present

## 2023-07-05 DIAGNOSIS — K760 Fatty (change of) liver, not elsewhere classified: Secondary | ICD-10-CM | POA: Diagnosis not present

## 2023-07-05 DIAGNOSIS — L720 Epidermal cyst: Secondary | ICD-10-CM | POA: Diagnosis not present

## 2023-07-05 DIAGNOSIS — Z792 Long term (current) use of antibiotics: Secondary | ICD-10-CM | POA: Diagnosis not present

## 2023-07-05 DIAGNOSIS — I2510 Atherosclerotic heart disease of native coronary artery without angina pectoris: Secondary | ICD-10-CM | POA: Diagnosis not present

## 2023-07-06 DIAGNOSIS — L723 Sebaceous cyst: Secondary | ICD-10-CM | POA: Diagnosis not present

## 2023-07-06 DIAGNOSIS — L089 Local infection of the skin and subcutaneous tissue, unspecified: Secondary | ICD-10-CM | POA: Diagnosis not present

## 2023-07-07 DIAGNOSIS — L089 Local infection of the skin and subcutaneous tissue, unspecified: Secondary | ICD-10-CM | POA: Diagnosis not present

## 2023-07-07 DIAGNOSIS — L723 Sebaceous cyst: Secondary | ICD-10-CM | POA: Diagnosis not present

## 2023-07-08 DIAGNOSIS — L089 Local infection of the skin and subcutaneous tissue, unspecified: Secondary | ICD-10-CM | POA: Diagnosis not present

## 2023-07-08 DIAGNOSIS — L723 Sebaceous cyst: Secondary | ICD-10-CM | POA: Diagnosis not present

## 2023-07-09 DIAGNOSIS — L723 Sebaceous cyst: Secondary | ICD-10-CM | POA: Diagnosis not present

## 2023-07-09 DIAGNOSIS — L089 Local infection of the skin and subcutaneous tissue, unspecified: Secondary | ICD-10-CM | POA: Diagnosis not present

## 2023-07-10 DIAGNOSIS — L089 Local infection of the skin and subcutaneous tissue, unspecified: Secondary | ICD-10-CM | POA: Diagnosis not present

## 2023-07-10 DIAGNOSIS — L723 Sebaceous cyst: Secondary | ICD-10-CM | POA: Diagnosis not present

## 2023-07-12 DIAGNOSIS — L089 Local infection of the skin and subcutaneous tissue, unspecified: Secondary | ICD-10-CM | POA: Diagnosis not present

## 2023-07-12 DIAGNOSIS — L723 Sebaceous cyst: Secondary | ICD-10-CM | POA: Diagnosis not present

## 2023-07-12 DIAGNOSIS — S31000A Unspecified open wound of lower back and pelvis without penetration into retroperitoneum, initial encounter: Secondary | ICD-10-CM | POA: Diagnosis not present

## 2023-07-12 DIAGNOSIS — S31001A Unspecified open wound of lower back and pelvis with penetration into retroperitoneum, initial encounter: Secondary | ICD-10-CM | POA: Diagnosis not present

## 2023-07-17 DIAGNOSIS — L723 Sebaceous cyst: Secondary | ICD-10-CM | POA: Diagnosis not present

## 2023-07-17 DIAGNOSIS — L089 Local infection of the skin and subcutaneous tissue, unspecified: Secondary | ICD-10-CM | POA: Diagnosis not present

## 2023-07-19 DIAGNOSIS — S31001A Unspecified open wound of lower back and pelvis with penetration into retroperitoneum, initial encounter: Secondary | ICD-10-CM | POA: Diagnosis not present

## 2023-07-29 DIAGNOSIS — I2510 Atherosclerotic heart disease of native coronary artery without angina pectoris: Secondary | ICD-10-CM | POA: Diagnosis not present

## 2023-07-29 DIAGNOSIS — I359 Nonrheumatic aortic valve disorder, unspecified: Secondary | ICD-10-CM | POA: Diagnosis not present

## 2023-07-31 DIAGNOSIS — L723 Sebaceous cyst: Secondary | ICD-10-CM | POA: Diagnosis not present

## 2023-07-31 DIAGNOSIS — L089 Local infection of the skin and subcutaneous tissue, unspecified: Secondary | ICD-10-CM | POA: Diagnosis not present

## 2023-08-08 DIAGNOSIS — H524 Presbyopia: Secondary | ICD-10-CM | POA: Diagnosis not present

## 2023-08-08 DIAGNOSIS — Z0100 Encounter for examination of eyes and vision without abnormal findings: Secondary | ICD-10-CM | POA: Diagnosis not present

## 2023-08-21 DIAGNOSIS — L723 Sebaceous cyst: Secondary | ICD-10-CM | POA: Diagnosis not present

## 2023-08-21 DIAGNOSIS — L720 Epidermal cyst: Secondary | ICD-10-CM | POA: Diagnosis not present

## 2023-08-21 DIAGNOSIS — L089 Local infection of the skin and subcutaneous tissue, unspecified: Secondary | ICD-10-CM | POA: Diagnosis not present

## 2023-08-21 DIAGNOSIS — I10 Essential (primary) hypertension: Secondary | ICD-10-CM | POA: Diagnosis not present

## 2023-08-21 DIAGNOSIS — E785 Hyperlipidemia, unspecified: Secondary | ICD-10-CM | POA: Diagnosis not present

## 2023-08-21 DIAGNOSIS — F17210 Nicotine dependence, cigarettes, uncomplicated: Secondary | ICD-10-CM | POA: Diagnosis not present

## 2023-08-21 DIAGNOSIS — Z79899 Other long term (current) drug therapy: Secondary | ICD-10-CM | POA: Diagnosis not present

## 2023-08-21 DIAGNOSIS — I2510 Atherosclerotic heart disease of native coronary artery without angina pectoris: Secondary | ICD-10-CM | POA: Diagnosis not present

## 2023-08-21 DIAGNOSIS — J449 Chronic obstructive pulmonary disease, unspecified: Secondary | ICD-10-CM | POA: Diagnosis not present

## 2023-08-29 ENCOUNTER — Inpatient Hospital Stay: Payer: Medicare HMO

## 2023-09-05 ENCOUNTER — Inpatient Hospital Stay: Payer: Medicare HMO | Admitting: Oncology

## 2023-11-07 ENCOUNTER — Encounter: Payer: Self-pay | Admitting: *Deleted

## 2023-11-07 DIAGNOSIS — L723 Sebaceous cyst: Secondary | ICD-10-CM | POA: Diagnosis not present

## 2023-11-07 DIAGNOSIS — L089 Local infection of the skin and subcutaneous tissue, unspecified: Secondary | ICD-10-CM | POA: Diagnosis not present

## 2023-11-08 DIAGNOSIS — Z01818 Encounter for other preprocedural examination: Secondary | ICD-10-CM | POA: Diagnosis not present

## 2023-11-08 DIAGNOSIS — Z20828 Contact with and (suspected) exposure to other viral communicable diseases: Secondary | ICD-10-CM | POA: Diagnosis not present

## 2023-11-12 DIAGNOSIS — L723 Sebaceous cyst: Secondary | ICD-10-CM | POA: Diagnosis not present

## 2023-11-12 DIAGNOSIS — L089 Local infection of the skin and subcutaneous tissue, unspecified: Secondary | ICD-10-CM | POA: Diagnosis not present

## 2023-11-14 DIAGNOSIS — F17210 Nicotine dependence, cigarettes, uncomplicated: Secondary | ICD-10-CM | POA: Diagnosis not present

## 2023-11-14 DIAGNOSIS — L02212 Cutaneous abscess of back [any part, except buttock]: Secondary | ICD-10-CM | POA: Diagnosis not present

## 2023-11-14 DIAGNOSIS — J432 Centrilobular emphysema: Secondary | ICD-10-CM | POA: Diagnosis not present

## 2023-11-14 DIAGNOSIS — I2510 Atherosclerotic heart disease of native coronary artery without angina pectoris: Secondary | ICD-10-CM | POA: Diagnosis not present

## 2023-11-14 DIAGNOSIS — Z8673 Personal history of transient ischemic attack (TIA), and cerebral infarction without residual deficits: Secondary | ICD-10-CM | POA: Diagnosis not present

## 2023-11-14 DIAGNOSIS — L720 Epidermal cyst: Secondary | ICD-10-CM | POA: Diagnosis not present

## 2023-11-14 DIAGNOSIS — E785 Hyperlipidemia, unspecified: Secondary | ICD-10-CM | POA: Diagnosis not present

## 2023-11-14 DIAGNOSIS — I10 Essential (primary) hypertension: Secondary | ICD-10-CM | POA: Diagnosis not present

## 2023-11-14 DIAGNOSIS — L723 Sebaceous cyst: Secondary | ICD-10-CM | POA: Diagnosis not present

## 2023-11-14 DIAGNOSIS — J449 Chronic obstructive pulmonary disease, unspecified: Secondary | ICD-10-CM | POA: Diagnosis not present

## 2023-11-14 DIAGNOSIS — Z20822 Contact with and (suspected) exposure to covid-19: Secondary | ICD-10-CM | POA: Diagnosis not present

## 2023-11-15 DIAGNOSIS — J439 Emphysema, unspecified: Secondary | ICD-10-CM | POA: Diagnosis not present

## 2023-11-15 DIAGNOSIS — L089 Local infection of the skin and subcutaneous tissue, unspecified: Secondary | ICD-10-CM | POA: Diagnosis not present

## 2023-11-15 DIAGNOSIS — F17210 Nicotine dependence, cigarettes, uncomplicated: Secondary | ICD-10-CM | POA: Diagnosis not present

## 2023-11-15 DIAGNOSIS — Z79899 Other long term (current) drug therapy: Secondary | ICD-10-CM | POA: Diagnosis not present

## 2023-11-15 DIAGNOSIS — Z7951 Long term (current) use of inhaled steroids: Secondary | ICD-10-CM | POA: Diagnosis not present

## 2023-11-15 DIAGNOSIS — L723 Sebaceous cyst: Secondary | ICD-10-CM | POA: Diagnosis not present

## 2023-11-15 DIAGNOSIS — Z7982 Long term (current) use of aspirin: Secondary | ICD-10-CM | POA: Diagnosis not present

## 2023-11-15 DIAGNOSIS — I2510 Atherosclerotic heart disease of native coronary artery without angina pectoris: Secondary | ICD-10-CM | POA: Diagnosis not present

## 2023-11-15 DIAGNOSIS — I10 Essential (primary) hypertension: Secondary | ICD-10-CM | POA: Diagnosis not present

## 2023-11-16 DIAGNOSIS — Z4800 Encounter for change or removal of nonsurgical wound dressing: Secondary | ICD-10-CM | POA: Diagnosis not present

## 2023-11-17 DIAGNOSIS — Z4800 Encounter for change or removal of nonsurgical wound dressing: Secondary | ICD-10-CM | POA: Diagnosis not present

## 2023-11-18 DIAGNOSIS — I10 Essential (primary) hypertension: Secondary | ICD-10-CM | POA: Diagnosis not present

## 2023-11-18 DIAGNOSIS — L089 Local infection of the skin and subcutaneous tissue, unspecified: Secondary | ICD-10-CM | POA: Diagnosis not present

## 2023-11-18 DIAGNOSIS — I2510 Atherosclerotic heart disease of native coronary artery without angina pectoris: Secondary | ICD-10-CM | POA: Diagnosis not present

## 2023-11-18 DIAGNOSIS — F17210 Nicotine dependence, cigarettes, uncomplicated: Secondary | ICD-10-CM | POA: Diagnosis not present

## 2023-11-18 DIAGNOSIS — L723 Sebaceous cyst: Secondary | ICD-10-CM | POA: Diagnosis not present

## 2023-11-18 DIAGNOSIS — Z79899 Other long term (current) drug therapy: Secondary | ICD-10-CM | POA: Diagnosis not present

## 2023-11-18 DIAGNOSIS — Z7951 Long term (current) use of inhaled steroids: Secondary | ICD-10-CM | POA: Diagnosis not present

## 2023-11-18 DIAGNOSIS — Z7982 Long term (current) use of aspirin: Secondary | ICD-10-CM | POA: Diagnosis not present

## 2023-11-18 DIAGNOSIS — J439 Emphysema, unspecified: Secondary | ICD-10-CM | POA: Diagnosis not present

## 2023-11-20 DIAGNOSIS — S31001A Unspecified open wound of lower back and pelvis with penetration into retroperitoneum, initial encounter: Secondary | ICD-10-CM | POA: Diagnosis not present

## 2023-11-20 DIAGNOSIS — S31000A Unspecified open wound of lower back and pelvis without penetration into retroperitoneum, initial encounter: Secondary | ICD-10-CM | POA: Diagnosis not present

## 2023-11-26 DIAGNOSIS — S31001A Unspecified open wound of lower back and pelvis with penetration into retroperitoneum, initial encounter: Secondary | ICD-10-CM | POA: Diagnosis not present

## 2023-11-26 DIAGNOSIS — S31000A Unspecified open wound of lower back and pelvis without penetration into retroperitoneum, initial encounter: Secondary | ICD-10-CM | POA: Diagnosis not present

## 2023-12-11 DIAGNOSIS — Z79899 Other long term (current) drug therapy: Secondary | ICD-10-CM | POA: Diagnosis not present

## 2023-12-11 DIAGNOSIS — I2510 Atherosclerotic heart disease of native coronary artery without angina pectoris: Secondary | ICD-10-CM | POA: Diagnosis not present

## 2023-12-11 DIAGNOSIS — Z7982 Long term (current) use of aspirin: Secondary | ICD-10-CM | POA: Diagnosis not present

## 2023-12-11 DIAGNOSIS — F17210 Nicotine dependence, cigarettes, uncomplicated: Secondary | ICD-10-CM | POA: Diagnosis not present

## 2023-12-11 DIAGNOSIS — J439 Emphysema, unspecified: Secondary | ICD-10-CM | POA: Diagnosis not present

## 2023-12-11 DIAGNOSIS — Z7951 Long term (current) use of inhaled steroids: Secondary | ICD-10-CM | POA: Diagnosis not present

## 2023-12-11 DIAGNOSIS — L723 Sebaceous cyst: Secondary | ICD-10-CM | POA: Diagnosis not present

## 2023-12-11 DIAGNOSIS — I10 Essential (primary) hypertension: Secondary | ICD-10-CM | POA: Diagnosis not present

## 2023-12-11 DIAGNOSIS — L089 Local infection of the skin and subcutaneous tissue, unspecified: Secondary | ICD-10-CM | POA: Diagnosis not present

## 2023-12-17 ENCOUNTER — Other Ambulatory Visit: Payer: Medicare HMO

## 2023-12-23 DIAGNOSIS — L0202 Furuncle of face: Secondary | ICD-10-CM | POA: Diagnosis not present

## 2023-12-25 DIAGNOSIS — I10 Essential (primary) hypertension: Secondary | ICD-10-CM | POA: Diagnosis not present

## 2023-12-25 DIAGNOSIS — Z7951 Long term (current) use of inhaled steroids: Secondary | ICD-10-CM | POA: Diagnosis not present

## 2023-12-25 DIAGNOSIS — F17210 Nicotine dependence, cigarettes, uncomplicated: Secondary | ICD-10-CM | POA: Diagnosis not present

## 2023-12-25 DIAGNOSIS — Z7982 Long term (current) use of aspirin: Secondary | ICD-10-CM | POA: Diagnosis not present

## 2023-12-25 DIAGNOSIS — L739 Follicular disorder, unspecified: Secondary | ICD-10-CM | POA: Diagnosis not present

## 2023-12-25 DIAGNOSIS — L089 Local infection of the skin and subcutaneous tissue, unspecified: Secondary | ICD-10-CM | POA: Diagnosis not present

## 2023-12-25 DIAGNOSIS — I2510 Atherosclerotic heart disease of native coronary artery without angina pectoris: Secondary | ICD-10-CM | POA: Diagnosis not present

## 2023-12-25 DIAGNOSIS — J449 Chronic obstructive pulmonary disease, unspecified: Secondary | ICD-10-CM | POA: Diagnosis not present

## 2023-12-25 DIAGNOSIS — L723 Sebaceous cyst: Secondary | ICD-10-CM | POA: Diagnosis not present

## 2023-12-25 NOTE — Progress Notes (Signed)
 Raymond Nelson 72 y.o. 01-Apr-1951 Phone: [phone] (home)  Address: [address] [place] [address]  MRN: [id number] Primary MD : Duwaine Rollo Jumper  [facility]   Problem List Items Addressed This Visit     Infected sebaceous cyst - Primary   He has history of an incision and drainage for an infected epidermal inclusion cyst that healed in September.  He then had a recurrence. He underwent an excision and drainage 11/14/23.  His wound has healed.  The scab was removed.    We discussed about excising the scar for a definitive procedure given his history of recurrence.  He is not interested in having any more surgery.  Clinically, there appears to be minimal to no cyst left and thus his risk of reinfection should be low.   Face lesion  He was seen for his chin folliculitis.  He has a follow up appointment and needs a skin check.  I personally spent over 20 minutes face-to-face and non-face-to-face in the care of this patient, which includes all pre, intra, and post visit time on the date of service.  All documented time was specific to the E/M visit and does not include any procedures that may have been performed.     Wound Care Visit  Past Medical History:  Diagnosis Date  . Angioedema 01/02/2023  . Aortic atherosclerosis (CMS-HCC)    per chest CT 06/18/2023. aortic valve calcification without dignificant stenosis 07/05/2023  . CAD (coronary artery disease)    Left main and three vessel coronary artery disease per Chest CT 06/18/2023  . COPD (chronic obstructive pulmonary disease) (CMS-HCC)    Emphysema per chest CT 06/18/2023  . Difficult intravenous access   . Hypertension   . Sleep apnea    history of. no longer has to use cpap. no longer snores per patient  . Small vessel disease (CMS-HCC)    Mild generalized parenchymal abnormality and chronic small vessel ischemic disease  . Tobacco abuse    Past Surgical History:  Procedure Laterality  Date  . HEMORRHOID SURGERY  11/1999  . PR DEBRIDEMENT SUBCUTANEOUS TISSUE 1ST 20 SQ CM/< N/A 11/14/2023   Procedure: DEBRIDEMENT, SUBCUTANEOUS TISSUE (INCLUDES EPIDERMIS AND DERMIS, IF PERFORMED); FIRST 20 SQ CM OR LESS;  Surgeon: Jumper Duwaine Rollo, MD;  Location: OR [facility];  Service: General Surgery  . PR INCISION & DRAINAGE ABSCESS COMPLICATED/MULTIPLE N/A 07/05/2023   Procedure: INCISION AND DRAINAGE OF ABSCESS (EG, CARBUNCLE, SUPPURATIVE HIDRADENITIS, CUTANEOUS OR SUBCUTANEOUS ABSCESS, CYST, FURUNCLE, OR PARONYCHIA); COMPLICATED OR MULTIPLE; ABDOMEN;  Surgeon: Jumper Duwaine Rollo, MD;  Location: OR [facility];  Service: General Surgery  . PR INCISION & DRAINAGE ABSCESS COMPLICATED/MULTIPLE N/A 11/14/2023   Procedure: INCISION AND DRAINAGE OF ABSCESS (EG, CARBUNCLE, SUPPURATIVE HIDRADENITIS, CUTANEOUS OR SUBCUTANEOUS ABSCESS, CYST, FURUNCLE, OR PARONYCHIA); COMPLICATED OR MULTIPLE;  Surgeon: Jumper Duwaine Rollo, MD;  Location: OR [facility];  Service: General Surgery  . VASECTOMY  1996   No Known Allergies  Meds:  Current Outpatient Medications:  .  amlodipine  (NORVASC ) 5 MG tablet, Take 1 tablet (5 mg total) by mouth every morning., Disp: , Rfl:  .  aspirin  (ECOTRIN) 81 MG tablet, Take 1 tablet (81 mg total) by mouth every morning., Disp: , Rfl:  .  diphenhydrAMINE  (ALLERGY) 25 mg tablet, Take 1 tablet (25 mg total) by mouth nightly as needed for sleep., Disp: , Rfl:  .  hydroCHLOROthiazide  12.5 MG tablet, Take 1 tablet (12.5 mg total) by mouth every morning., Disp: , Rfl:  .  rosuvastatin (CRESTOR) 5 MG tablet, Take 1 tablet (5 mg total) by mouth daily. (Patient taking differently: Take 1 tablet (5 mg total) by mouth every morning.), Disp: 90 tablet, Rfl: 1 .  traMADol (ULTRAM) 50 mg tablet, Take 1 tablet (50 mg total) by mouth daily as needed for pain for up to 5 days., Disp: 5 tablet, Rfl: 0 .  TRELEGY ELLIPTA 200-62.5-25 mcg DsDv, Inhale 1 puff daily as needed (sob, wheezing).,  Disp: , Rfl:   SocHx:  reports that he has been smoking cigarettes. He started smoking about 53 years ago. He has a 76.3 pack-year smoking history. He has never used smokeless tobacco. He reports that he does not currently use alcohol . He reports that he does not use drugs.  FamHx: He indicated that his mother is deceased. He indicated that his father is deceased.   Review of Systems A 12 system review of systems was negative except as noted in HPI  No special needs identified  Subjective:  12/25/23  He returns for follow up.  He thinks it has healed.  12/11/23  He returns for follow up.  He is concerned about a place on his face.  He reports no issues with his dressing changes.  11/12/23  He was scheduled for an I&D on 11/08/23 but he developed a head cold and congestion.  His COVID 19, Flu A and B were negative. I personally reviewed the report.  He reports he is doing his heating pad and taking the antibiotics.  Overall, he feels better.  11/07/23  He presents with a recurrent infection of a back epidermal inclusion cyst.  He reports it started around Thanksgiving.  He reports taking a hot shower today and it started to drain.  He denies any fevers or chills.  08/21/23  He returns for follow up.  It has healed.  07/31/23  He returns for follow up.  Home health nursing reported that they could not pack the wound.  07/17/23  Raymond Nelson presents to the wound care clinic for follow up after an incision and drainage of an infected epidermal inclusion cyst on 07/05/23.  A penrose was place and subsequently removed on 07/10/23.    Aerobic/Anaerobic Culture 4+ Mixed Anaerobes Isolated Abnormal       Specimen Source: Back OR Specimen Description: Left back epidermal inclusion cyst        Gram Stain Result No polymorphonuclear leukocytes seen    Probable 2+ Gram positive cocci        Objective:   BP 139/91 (BP Site: R Arm, BP Position: Sitting)   Pulse 93   Temp 36.2 C  (97.2 F) (Temporal)   Resp 16   SpO2 98%  General:  well appearing  Pulmonary: CTAB, no wheezes, rhonci, crackles  CV:   RRR, S1,S2, no murmurs, gallops,rubs  GI: soft, bowel sounds active, non-tender  Wound:   Healed

## 2023-12-26 ENCOUNTER — Ambulatory Visit: Payer: Medicare HMO | Admitting: Oncology

## 2024-01-28 DIAGNOSIS — L0202 Furuncle of face: Secondary | ICD-10-CM | POA: Diagnosis not present

## 2024-01-28 DIAGNOSIS — D485 Neoplasm of uncertain behavior of skin: Secondary | ICD-10-CM | POA: Diagnosis not present

## 2024-01-28 DIAGNOSIS — B078 Other viral warts: Secondary | ICD-10-CM | POA: Diagnosis not present

## 2024-02-13 DIAGNOSIS — L739 Follicular disorder, unspecified: Secondary | ICD-10-CM | POA: Diagnosis not present

## 2024-02-13 DIAGNOSIS — D485 Neoplasm of uncertain behavior of skin: Secondary | ICD-10-CM | POA: Diagnosis not present

## 2024-02-13 DIAGNOSIS — B079 Viral wart, unspecified: Secondary | ICD-10-CM | POA: Diagnosis not present

## 2024-02-27 DIAGNOSIS — B078 Other viral warts: Secondary | ICD-10-CM | POA: Diagnosis not present

## 2024-02-27 DIAGNOSIS — R208 Other disturbances of skin sensation: Secondary | ICD-10-CM | POA: Diagnosis not present

## 2024-02-27 DIAGNOSIS — L538 Other specified erythematous conditions: Secondary | ICD-10-CM | POA: Diagnosis not present

## 2024-06-03 ENCOUNTER — Encounter: Payer: Self-pay | Admitting: *Deleted

## 2024-06-03 NOTE — Progress Notes (Signed)
 Spoke with patient via telephone.  Advised him that it was time to schedule his lung cancer screening exam.  He states at this time, he does not have time and does not desire to schedule anything.  I advised him of the importance of this exam and he said he would call us  should he wish to schedule in the future.  I have sent a letter to his PCP advising that he has been removed from our program at this time and if patient desires to return, his PCP can send a new referral.

## 2024-07-14 ENCOUNTER — Other Ambulatory Visit (HOSPITAL_COMMUNITY): Payer: Self-pay | Admitting: Family Medicine

## 2024-07-14 DIAGNOSIS — F17200 Nicotine dependence, unspecified, uncomplicated: Secondary | ICD-10-CM
